# Patient Record
Sex: Male | Born: 1950
Health system: Southern US, Community
[De-identification: ages and names within clinical notes are randomized; demographics above are authoritative.]

## PROBLEM LIST (undated history)

## (undated) DIAGNOSIS — I10 Essential (primary) hypertension: Secondary | ICD-10-CM

## (undated) DIAGNOSIS — Z720 Tobacco use: Secondary | ICD-10-CM

## (undated) DIAGNOSIS — G459 Transient cerebral ischemic attack, unspecified: Secondary | ICD-10-CM

## (undated) HISTORY — PX: KNEE SURGERY: SHX244

## (undated) HISTORY — PX: CARPAL TUNNEL RELEASE: SHX101

---

## 1999-10-19 ENCOUNTER — Inpatient Hospital Stay (HOSPITAL_COMMUNITY): Admission: EM | Admit: 1999-10-19 | Discharge: 1999-10-20 | Payer: Self-pay | Admitting: Emergency Medicine

## 1999-10-19 ENCOUNTER — Encounter: Payer: Self-pay | Admitting: Emergency Medicine

## 2002-07-10 ENCOUNTER — Encounter: Payer: Self-pay | Admitting: *Deleted

## 2002-07-10 ENCOUNTER — Emergency Department (HOSPITAL_COMMUNITY): Admission: EM | Admit: 2002-07-10 | Discharge: 2002-07-10 | Payer: Self-pay | Admitting: *Deleted

## 2004-09-14 ENCOUNTER — Ambulatory Visit (HOSPITAL_BASED_OUTPATIENT_CLINIC_OR_DEPARTMENT_OTHER): Admission: RE | Admit: 2004-09-14 | Discharge: 2004-09-14 | Payer: Self-pay | Admitting: Orthopedic Surgery

## 2005-03-13 ENCOUNTER — Encounter: Admission: RE | Admit: 2005-03-13 | Discharge: 2005-03-13 | Payer: Self-pay | Admitting: Family Medicine

## 2006-02-14 ENCOUNTER — Emergency Department (HOSPITAL_COMMUNITY): Admission: EM | Admit: 2006-02-14 | Discharge: 2006-02-14 | Payer: Self-pay | Admitting: Emergency Medicine

## 2015-01-31 ENCOUNTER — Emergency Department (HOSPITAL_COMMUNITY): Payer: Medicare Other

## 2015-01-31 ENCOUNTER — Inpatient Hospital Stay (HOSPITAL_COMMUNITY)
Admission: EM | Admit: 2015-01-31 | Discharge: 2015-02-03 | DRG: 069 | Disposition: A | Discharge status: Home / Self Care | Payer: Medicare Other | Attending: Internal Medicine | Admitting: Internal Medicine

## 2015-01-31 ENCOUNTER — Encounter (HOSPITAL_COMMUNITY): Payer: Self-pay | Admitting: Emergency Medicine

## 2015-01-31 ENCOUNTER — Inpatient Hospital Stay (HOSPITAL_COMMUNITY): Payer: Medicare Other

## 2015-01-31 DIAGNOSIS — Z8249 Family history of ischemic heart disease and other diseases of the circulatory system: Secondary | ICD-10-CM | POA: Diagnosis not present

## 2015-01-31 DIAGNOSIS — I10 Essential (primary) hypertension: Secondary | ICD-10-CM | POA: Diagnosis present

## 2015-01-31 DIAGNOSIS — F17210 Nicotine dependence, cigarettes, uncomplicated: Secondary | ICD-10-CM | POA: Diagnosis present

## 2015-01-31 DIAGNOSIS — I639 Cerebral infarction, unspecified: Secondary | ICD-10-CM | POA: Diagnosis not present

## 2015-01-31 DIAGNOSIS — Z720 Tobacco use: Secondary | ICD-10-CM | POA: Diagnosis present

## 2015-01-31 DIAGNOSIS — W19XXXA Unspecified fall, initial encounter: Secondary | ICD-10-CM | POA: Diagnosis present

## 2015-01-31 DIAGNOSIS — R531 Weakness: Secondary | ICD-10-CM | POA: Diagnosis present

## 2015-01-31 DIAGNOSIS — Z9119 Patient's noncompliance with other medical treatment and regimen: Secondary | ICD-10-CM | POA: Diagnosis present

## 2015-01-31 DIAGNOSIS — Z7982 Long term (current) use of aspirin: Secondary | ICD-10-CM | POA: Diagnosis not present

## 2015-01-31 DIAGNOSIS — R29810 Facial weakness: Secondary | ICD-10-CM | POA: Diagnosis present

## 2015-01-31 DIAGNOSIS — N401 Enlarged prostate with lower urinary tract symptoms: Secondary | ICD-10-CM | POA: Diagnosis present

## 2015-01-31 DIAGNOSIS — K921 Melena: Secondary | ICD-10-CM | POA: Diagnosis present

## 2015-01-31 DIAGNOSIS — Z79899 Other long term (current) drug therapy: Secondary | ICD-10-CM | POA: Diagnosis not present

## 2015-01-31 DIAGNOSIS — R4701 Aphasia: Secondary | ICD-10-CM | POA: Diagnosis present

## 2015-01-31 DIAGNOSIS — M4802 Spinal stenosis, cervical region: Secondary | ICD-10-CM | POA: Diagnosis present

## 2015-01-31 DIAGNOSIS — Z9114 Patient's other noncompliance with medication regimen: Secondary | ICD-10-CM | POA: Diagnosis present

## 2015-01-31 DIAGNOSIS — G319 Degenerative disease of nervous system, unspecified: Secondary | ICD-10-CM | POA: Diagnosis present

## 2015-01-31 DIAGNOSIS — G43909 Migraine, unspecified, not intractable, without status migrainosus: Secondary | ICD-10-CM | POA: Diagnosis present

## 2015-01-31 DIAGNOSIS — R2990 Unspecified symptoms and signs involving the nervous system: Secondary | ICD-10-CM

## 2015-01-31 DIAGNOSIS — E785 Hyperlipidemia, unspecified: Secondary | ICD-10-CM | POA: Diagnosis present

## 2015-01-31 DIAGNOSIS — G459 Transient cerebral ischemic attack, unspecified: Secondary | ICD-10-CM | POA: Diagnosis present

## 2015-01-31 DIAGNOSIS — K625 Hemorrhage of anus and rectum: Secondary | ICD-10-CM

## 2015-01-31 DIAGNOSIS — Z8673 Personal history of transient ischemic attack (TIA), and cerebral infarction without residual deficits: Secondary | ICD-10-CM

## 2015-01-31 HISTORY — DX: Transient cerebral ischemic attack, unspecified: G45.9

## 2015-01-31 HISTORY — DX: Tobacco use: Z72.0

## 2015-01-31 HISTORY — DX: Essential (primary) hypertension: I10

## 2015-01-31 LAB — I-STAT CHEM 8, ED
BUN: 16 mg/dL (ref 6–23)
CREATININE: 0.9 mg/dL (ref 0.50–1.35)
Calcium, Ion: 1.12 mmol/L — ABNORMAL LOW (ref 1.13–1.30)
Chloride: 103 mmol/L (ref 96–112)
Glucose, Bld: 104 mg/dL — ABNORMAL HIGH (ref 70–99)
HCT: 48 % (ref 39.0–52.0)
Hemoglobin: 16.3 g/dL (ref 13.0–17.0)
POTASSIUM: 4 mmol/L (ref 3.5–5.1)
SODIUM: 139 mmol/L (ref 135–145)
TCO2: 23 mmol/L (ref 0–100)

## 2015-01-31 LAB — URINALYSIS, ROUTINE W REFLEX MICROSCOPIC
BILIRUBIN URINE: NEGATIVE
Glucose, UA: NEGATIVE mg/dL
Hgb urine dipstick: NEGATIVE
KETONES UR: NEGATIVE mg/dL
Leukocytes, UA: NEGATIVE
Nitrite: NEGATIVE
Protein, ur: NEGATIVE mg/dL
Specific Gravity, Urine: 1.033 — ABNORMAL HIGH (ref 1.005–1.030)
Urobilinogen, UA: 1 mg/dL (ref 0.0–1.0)
pH: 7 (ref 5.0–8.0)

## 2015-01-31 LAB — CBC
HCT: 42.6 % (ref 39.0–52.0)
Hemoglobin: 14.5 g/dL (ref 13.0–17.0)
MCH: 31.3 pg (ref 26.0–34.0)
MCHC: 34 g/dL (ref 30.0–36.0)
MCV: 92 fL (ref 78.0–100.0)
Platelets: 150 10*3/uL (ref 150–400)
RBC: 4.63 MIL/uL (ref 4.22–5.81)
RDW: 12.8 % (ref 11.5–15.5)
WBC: 4.7 10*3/uL (ref 4.0–10.5)

## 2015-01-31 LAB — DIFFERENTIAL
BASOS ABS: 0 10*3/uL (ref 0.0–0.1)
BASOS PCT: 0 % (ref 0–1)
Eosinophils Absolute: 0.1 10*3/uL (ref 0.0–0.7)
Eosinophils Relative: 2 % (ref 0–5)
Lymphocytes Relative: 35 % (ref 12–46)
Lymphs Abs: 1.7 10*3/uL (ref 0.7–4.0)
MONO ABS: 0.8 10*3/uL (ref 0.1–1.0)
Monocytes Relative: 17 % — ABNORMAL HIGH (ref 3–12)
NEUTROS ABS: 2.2 10*3/uL (ref 1.7–7.7)
Neutrophils Relative %: 46 % (ref 43–77)

## 2015-01-31 LAB — COMPREHENSIVE METABOLIC PANEL
ALT: 21 U/L (ref 0–53)
AST: 22 U/L (ref 0–37)
Albumin: 4.1 g/dL (ref 3.5–5.2)
Alkaline Phosphatase: 77 U/L (ref 39–117)
Anion gap: 6 (ref 5–15)
BILIRUBIN TOTAL: 0.8 mg/dL (ref 0.3–1.2)
BUN: 13 mg/dL (ref 6–23)
CHLORIDE: 105 mmol/L (ref 96–112)
CO2: 25 mmol/L (ref 19–32)
CREATININE: 0.88 mg/dL (ref 0.50–1.35)
Calcium: 8.9 mg/dL (ref 8.4–10.5)
GFR calc Af Amer: >90 mL/min (ref >90)
GFR, EST NON AFRICAN AMERICAN: 88 mL/min — AB (ref >90)
GLUCOSE: 100 mg/dL — AB (ref 70–99)
POTASSIUM: 4 mmol/L (ref 3.5–5.1)
Sodium: 136 mmol/L (ref 135–145)
Total Protein: 7.2 g/dL (ref 6.0–8.3)

## 2015-01-31 LAB — RAPID URINE DRUG SCREEN, HOSP PERFORMED
Amphetamines: NOT DETECTED
BARBITURATES: NOT DETECTED
Benzodiazepines: NOT DETECTED
Cocaine: NOT DETECTED
Opiates: NOT DETECTED
TETRAHYDROCANNABINOL: NOT DETECTED

## 2015-01-31 LAB — PROTIME-INR
INR: 1.06 (ref 0.00–1.49)
PROTHROMBIN TIME: 14 s (ref 11.6–15.2)

## 2015-01-31 LAB — I-STAT TROPONIN, ED: Troponin i, poc: 0.01 ng/mL (ref 0.00–0.08)

## 2015-01-31 LAB — TSH: TSH: 1.145 u[IU]/mL (ref 0.350–4.500)

## 2015-01-31 LAB — ETHANOL

## 2015-01-31 LAB — GLUCOSE, CAPILLARY: Glucose-Capillary: 94 mg/dL (ref 70–99)

## 2015-01-31 LAB — APTT: aPTT: 28 seconds (ref 24–37)

## 2015-01-31 MED ORDER — PNEUMOCOCCAL VAC POLYVALENT 25 MCG/0.5ML IJ INJ
0.5000 mL | INJECTION | INTRAMUSCULAR | Status: AC
  Administered 2015-02-01: 0.5 mL via INTRAMUSCULAR
  Filled 2015-01-31: qty 0.5

## 2015-01-31 MED ORDER — STROKE: EARLY STAGES OF RECOVERY BOOK
Freq: Once | Status: AC
  Administered 2015-01-31: 14:00:00

## 2015-01-31 MED ORDER — SENNOSIDES-DOCUSATE SODIUM 8.6-50 MG PO TABS: 1.0000 | ORAL_TABLET | Freq: Every evening | ORAL | Status: DC | PRN

## 2015-01-31 MED ORDER — INFLUENZA VAC SPLIT QUAD 0.5 ML IM SUSY
0.5000 mL | PREFILLED_SYRINGE | INTRAMUSCULAR | Status: AC
  Administered 2015-02-01: 0.5 mL via INTRAMUSCULAR
  Filled 2015-01-31 (×2): qty 0.5

## 2015-01-31 MED ORDER — HEPARIN SODIUM (PORCINE) 5000 UNIT/ML IJ SOLN
5000.0000 [IU] | Freq: Three times a day (TID) | INTRAMUSCULAR | Status: DC
  Administered 2015-01-31 – 2015-02-03 (×9): 5000 [IU] via SUBCUTANEOUS
  Filled 2015-01-31 (×10): qty 1

## 2015-01-31 MED ORDER — ASPIRIN 300 MG RE SUPP: 300.0000 mg | Freq: Every day | RECTAL | Status: DC

## 2015-01-31 MED ORDER — IOHEXOL 350 MG/ML SOLN
80.0000 mL | Freq: Once | INTRAVENOUS | Status: AC | PRN
  Administered 2015-01-31: 80 mL via INTRAVENOUS

## 2015-01-31 MED ORDER — ASPIRIN 325 MG PO TABS
325.0000 mg | ORAL_TABLET | Freq: Every day | ORAL | Status: DC
  Administered 2015-01-31 – 2015-02-03 (×4): 325 mg via ORAL
  Filled 2015-01-31 (×4): qty 1

## 2015-01-31 NOTE — ED Provider Notes (Signed)
CSN: [id number]     Arrival date & time 01/31/15  [id number] History   First MD Initiated Contact with Patient 01/31/15 [phone]     Chief Complaint  Patient presents with  . Code Stroke     (Consider location/radiation/quality/duration/timing/severity/associated sxs/prior Treatment) The history is provided by the patient.  Alexander Anthony is a 64 y.o. male here presenting with code stroke. Patient woke up this morning and was weak on the right side and then has slurred speech and fell. He had a similar symptoms yesterday lasted 45 minutes but didn't come to the hospital. Patient has not seen doctors in years.   Level V caveat- condition of patient.    History reviewed. No pertinent past medical history. Past Surgical History  Procedure Laterality Date  . Knee surgery      Right   No family history on file. History  Substance Use Topics  . Smoking status: Current Every Day Smoker  . Smokeless tobacco: Not on file  . Alcohol Use: No    Review of Systems  Neurological: Positive for speech difficulty and weakness.  All other systems reviewed and are negative.     Allergies  Review of patient's allergies indicates no known allergies.  Home Medications   Prior to Admission medications   Not on File   BP 183/91 mmHg  Pulse 63  Temp(Src) 97.7 F (36.5 C) (Oral)  Resp 15  SpO2 100% Physical Exam  Constitutional:  Chronically ill   HENT:  Head: Normocephalic.  Mouth/Throat: Oropharynx is clear and moist.  Eyes: Pupils are equal, round, and reactive to light.  Neck:  C collar in place  Cardiovascular: Normal rate, regular rhythm and normal heart sounds.   Pulmonary/Chest: Effort normal and breath sounds normal. No respiratory distress. He has no wheezes. He has no rales.  Abdominal: Soft. Bowel sounds are normal. He exhibits no distension. There is no tenderness. There is no rebound.  Musculoskeletal: Normal range of motion.  Mild mid thoracic tenderness   Neurological: He  is alert.  Mild slurred speech. Strength 4/5 R side   Skin: Skin is warm and dry.  Psychiatric: He has a normal mood and affect. His behavior is normal. Judgment and thought content normal.  Nursing note and vitals reviewed.   ED Course  Procedures (including critical care time) Labs Review Labs Reviewed  DIFFERENTIAL - Abnormal; Notable for the following:    Monocytes Relative 17 (*)    All other components within normal limits  COMPREHENSIVE METABOLIC PANEL - Abnormal; Notable for the following:    Glucose, Bld 100 (*)    GFR calc non Af Amer 88 (*)    All other components within normal limits  I-STAT CHEM 8, ED - Abnormal; Notable for the following:    Glucose, Bld 104 (*)    Calcium, Ion 1.12 (*)    All other components within normal limits  ETHANOL  PROTIME-INR  APTT  CBC  URINE RAPID DRUG SCREEN (HOSP PERFORMED)  URINALYSIS, ROUTINE W REFLEX MICROSCOPIC  I-STAT TROPOININ, ED  Rosezena SensorI-STAT TROPOININ, ED    Imaging Review Dg Chest 1 View  01/31/2015   CLINICAL DATA:  Fall, stroke  EXAM: CHEST  1 VIEW  COMPARISON:  None.  FINDINGS: Borderline cardiomegaly. Central vascular congestion without convincing pulmonary edema. Study is limited by poor inspiration. No segmental infiltrate.  IMPRESSION: Limited study by poor inspiration. Central vascular congestion without convincing pulmonary edema. No segmental infiltrate.   Electronically Signed   By: Lang SnowLiviu  Pop M.D.   On: 01/31/2015 08:51   Dg Thoracic Spine 2 View  01/31/2015   CLINICAL DATA:  Fall, upper back pain  EXAM: THORACIC SPINE - 2 VIEW  COMPARISON:  None.  FINDINGS: Three views of thoracic spine submitted. No acute fracture or subluxation degenerative changes are noted mid and lower thoracic spine. Alignment and vertebral body heights are preserved.  IMPRESSION: No acute fracture or subluxation. Degenerative changes mid and lower thoracic spine.   Electronically Signed   By: Natasha Mead M.D.   On: 01/31/2015 08:52   Dg Pelvis  1-2 Views  01/31/2015   CLINICAL DATA:  Pain following trauma/fall  EXAM: PELVIS - 1-2 VIEW  COMPARISON:  None.  FINDINGS: Contrast is seen in the distal ureters and urinary bladder. There is no demonstrable fracture or dislocation. Joint spaces appear intact. No erosive change. 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  IMPRESSION: No fracture or dislocation. 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 Joint spaces appear intact.   Electronically Signed   By: Bretta Bang III M.D.   On: 01/31/2015 08:51   Ct Head Wo Contrast  01/31/2015   CLINICAL DATA:  Code stroke, found on the floor, right side weakness  EXAM: CT HEAD WITHOUT CONTRAST  TECHNIQUE: Contiguous axial images were obtained from the base of the skull through the vertex without intravenous contrast.  COMPARISON:  None.  FINDINGS: No skull fracture is noted. There is mucosal thickening with complete opacification of the left maxillary sinus. There is thickening of bony wall of the left maxillary sinus. Mucosal thickening with partial opacification left ethmoid air cells. Mucosal thickening left sphenoid sinus. Mucosal thickening with complete opacification left frontal sinus.  No intracranial hemorrhage, mass effect or midline shift. No definite acute cortical infarction. No mass lesion is noted on this unenhanced scan. Mild cerebral atrophy.  IMPRESSION: No definite acute cortical infarction. Mild cerebral atrophy. No intracranial hemorrhage, mass effect or midline shift. Mucosal thickening with complete opacification of the left maxillary sinus. Thickening of bony wall of left maxillary sinus. Mucosal thickening with partial opacification left ethmoid air cells. Mucosal thickening left sphenoid sinus. Complete opacification left frontal sinus.  These results were called by telephone at the time of interpretation on 01/31/2015 at 8:16 am to Dr. Thad Ranger, who verbally acknowledged these results.   Electronically Signed   By: Natasha Mead M.D.   On: 01/31/2015  08:17     EKG Interpretation   Date/Time:  Tuesday January 31 2015 08:06:19 EST Ventricular Rate:  66 PR Interval:  245 QRS Duration: 72 QT Interval:  415 QTC Calculation: 435 R Axis:   18 Text Interpretation:  Sinus rhythm Prolonged PR interval Probable left  atrial enlargement Anterior infarct, old prolonged PR new Confirmed by Jaiah Weigel   MD, Odelle Kosier ([id number]) on 01/31/2015 8:43:57 AM      MDM   Final diagnoses:  Marletta Lor    Alexander Anthony is a 64 y.o. male here with fall, code stroke. Dr. Thad Ranger at bedside. Doesn't need TPA currently. Neuro ordered CT angio neck which is unremarkable. Recommend admission for stroke workup.     Richardean Canal, MD 01/31/15 [phone]

## 2015-01-31 NOTE — H&P (Signed)
Date: 01/31/2015               Patient Name:  Alexander Anthony MRN: [id number]  DOB: October 20, 1951 Age / Sex: 64 y.o., male   PCP: No primary care provider on file.         Medical Service: Internal Medicine Teaching Service         Attending Physician: Dr. Farley Ly, MD    First Contact: Dr. Danella Penton Pager: [phone]  Second Contact: Dr. Andrey Campanile Pager: [phone]       After Hours (After 5p/  First Contact Pager: [phone]  weekends / holidays): Second Contact Pager: [phone]   Chief Complaint: Right sided weakness with trouble speaking  History of Present Illness: 64yo M w/ PMH HTN (noncompliant with medications) and tobacco abuse presents with R sided weakness and aphasia. He states that the symptoms began yesterday with numbness and weakness in his right face, hand, and leg, and he then developed slurred speech. He declined coming to the ED at that time per pt and his wife. This morning the weakness worsened and he fell but denies LOC or injury. EMS was called and the patient was brought to the ED as a code stroke. In the ED, his speech has improved and his weakness is improving per pt. He states that he is having tingling in his right hand and fingers.  Imaging in the ED negative for fractures after fall.   He states that he has been on medication for his blood pressure in the past but has not taken any in years. He does smoke 2-4 cigars a day since 1980. He does not do drugs and only drinks beer approximately once a month. He is on no medications.   He does endorse intermittent bloody stools and abdominal pain with vomiting over the past year. He denies blood on the toilet paper and states that it is in the toilet after bowel movements. He has never had a colonoscopy.  He has a brother with DM and HLD. His father died from an MI and mother of cerebral aneurysm.    Meds: No current facility-administered medications for this encounter.   Current Outpatient Prescriptions  Medication  Sig Dispense Refill  . aspirin 81 MG tablet Take 81 mg by mouth daily as needed for pain.    Marland Kitchen loratadine (CLARITIN) 10 MG tablet Take 10 mg by mouth daily as needed for allergies.    . sodium chloride (OCEAN) 0.65 % SOLN nasal spray Place 1 spray into both nostrils as needed for congestion.      Allergies: Allergies as of 01/31/2015  . (No Known Allergies)   History reviewed. No pertinent past medical history. Past Surgical History  Procedure Laterality Date  . Knee surgery      Right   No family history on file. History   Social History  . Marital Status: Married    Spouse Name: N/A  . Number of Children: N/A  . Years of Education: N/A   Occupational History  . Not on file.   Social History Main Topics  . Smoking status: Current Every Day Smoker  . Smokeless tobacco: Not on file  . Alcohol Use: No  . Drug Use: No  . Sexual Activity: Not on file   Other Topics Concern  . Not on file   Social History Narrative  . No narrative on file    Review of Systems: Review of Systems  Constitutional: Denies fever, chills, or appetite change.  HEENT: Denies trouble swallowing, neck pain.  Respiratory: Denies SOB, DOE, cough. +wheezing.  Cardiovascular: Denies chest pain, palpitations or leg swelling.  Gastrointestinal: +nausea, vomiting, abdominal pain, and blood in his stool. Denies diarrhea, constipation.  Genitourinary: Denies dysuria, urgency, hematuria, flank pain or difficulty urinating. +urinary frequency and hesitancy with urination. Musculoskeletal: Denies myalgias Skin: Denies rashes.  Neurological: Denies dizziness, headaches, or syncope. +right sided weakness, numbness, and tingling, and slurred speech.  Psychiatric/Behavioral: Denies suicidal ideation, mood changes, confusion    Physical Exam: Blood pressure 157/85, pulse 61, temperature 97.5 F (36.4 C), temperature source Oral, resp. rate 18, SpO2 97 %. General: Well-developed, and cooperative on  examination, in no acute distress.  Head: Normocephalic and atraumatic.  Eyes: EOMI, pupils equal, round, and reactive to light, no injection and anicteric.  Mouth: Pharynx pink and moist, no erythema or exudates.  Neck: Supple, full ROM, no JVD.  Lungs: CTAB anteriorly, normal respiratory effort, no accessory muscle use, no crackles, and no wheezes. Exam limited due to pt lying flat, given code stroke Heart: Regular rate, regular rhythm, no murmur, no gallop, and no rub.  Abdomen: Soft, TTP in mid epigastrium and mid to left lower quadrant. Non-distended, normal bowel sounds, no guarding, no rebound tenderness, no organomegaly or masses palpable. Msk: No joint swelling, warmth, or erythema.  Extremities: 2+ DP pulses bilaterally. No edema Neurologic: Alert & oriented X3, cranial nerves II-XII intact, except in V 1-3 with diminished sensation on the right face and mild right facial droop, strength 3/5 in RU&LE, 5/5 in LU&LE, sensation diminished on right side but intact on left to light touch, gait not observed.  Skin: Turgor normal.  Psych: Memory intact for recent and remote, good eye contact, not anxious or depressed appearing.    Lab results: Basic Metabolic Panel:  Recent Labs  16/09/9601/23/16 0744 01/31/15 0749  NA 136 139  K 4.0 4.0  CL 105 103  CO2 25  --   GLUCOSE 100* 104*  BUN 13 16  CREATININE 0.88 0.90  CALCIUM 8.9  --    Liver Function Tests:  Recent Labs  01/31/15 0744  AST 22  ALT 21  ALKPHOS 77  BILITOT 0.8  PROT 7.2  ALBUMIN 4.1   CBC:  Recent Labs  01/31/15 0744 01/31/15 0749  WBC 4.7  --   NEUTROABS 2.2  --   HGB 14.5 16.3  HCT 42.6 48.0  MCV 92.0  --   PLT 150  --    Cardiac Enzymes: 01/31/15 i-stat troponin 0.01  Hemoglobin A1C: Pending  Fasting Lipid Panel: Pending  Thyroid Function Tests: Pending  Coagulation:  Recent Labs  01/31/15 0744  LABPROT 14.0  INR 1.06   Urine Drug Screen: Drugs of Abuse  Pending  Alcohol  Level:  Recent Labs  01/31/15 0742  ETH <5   Urinalysis: Pending   Imaging results:  Ct Angio Head W/cm &/or Wo Cm  01/31/2015   CLINICAL DATA:  64 year old male code stroke. Found down with right side weakness. Initial encounter.  EXAM: CT ANGIOGRAPHY HEAD AND NECK  TECHNIQUE: Multidetector CT imaging of the head and neck was performed using the standard protocol during bolus administration of intravenous contrast. Multiplanar CT image reconstructions and MIPs were obtained to evaluate the vascular anatomy. Carotid stenosis measurements (when applicable) are obtained utilizing NASCET criteria, using the distal internal carotid diameter as the denominator.  CONTRAST:  80mL OMNIPAQUE IOHEXOL 350 MG/ML SOLN  COMPARISON:  Head CT without contrast 0750 hours today.  FINDINGS: CT HEAD  Brain: Stable gray-white matter differentiation. No evidence of cortically based acute infarction identified. No intracranial mass effect.  Calvarium and skull base: Stable and intact.  Paranasal sinuses: Obstructive pattern left paranasal sinusitis with mucoperiosteal thickening. Retained secretions in the left nasal cavity and nasopharynx.  Orbits: Negative.  CTA NECK  Aortic arch: Ectatic arch. Three vessel arch configuration. Tortuous proximal great vessels. No great vessel origin stenosis.  Right carotid system: Negative right CCA aside from mild proximal tortuosity. Soft and calcified plaque at the right carotid bifurcation and right ICA bulb not resulting in stenosis.  Left carotid system: Negative left CCA origin. Proximal left CCA tortuosity. Mild soft and calcified plaque at the left carotid bifurcation and left ICA bulb with no stenosis.  Vertebral arteries:No proximal subclavian arteries stenosis. Both vertebral artery origins appear within normal limits.  Tortuous proximal vertebral arteries more so the left. Fairly codominant vertebrals. Tortuous distal cervical vertebral arteries, no vertebral artery stenosis in  the neck.  Skeleton: No acute osseous abnormality identified. Cervical spine findings are reported separately.  Other neck: Negative lung apices aside from atelectasis. No superior mediastinal lymphadenopathy. Thyroid, larynx, pharynx, parapharyngeal spaces, retropharyngeal space, sublingual space, submandibular glands, and parotid glands are within normal limits. No cervical lymphadenopathy.  CTA HEAD  Posterior circulation: Codominant distal vertebral arteries are patent without stenosis. Normal PICA origins. Tortuous but otherwise negative vertebrobasilar junction. Tortuous basilar artery without stenosis. SCA and PCA origins are within normal limits. P1 segment tortuosity is specially on the left. Posterior communicating arteries are diminutive or absent. Mild to moderate irregularity of the left P2 segment. Mild to moderate irregularity of the right P3 branches. Preserved bilateral distal PCA flow.  Anterior circulation: Patent ICA siphons with no siphon stenosis. Calcified siphon plaque more so on the left. Ophthalmic artery origins are within normal limits. Tortuous distal siphons and carotid termini. MCA and ACA origins are within normal limits.  Tortuous proximal ACAs. Diminutive or absent anterior communicating artery. Negative ACA branches aside from tortuosity.  Right MCA branches are normal aside from mild irregularity.  Left MCA M1 segment is mildly irregular. Left MCA bifurcation is patent. Left MCA branches are normal aside from mild irregularity.  Venous sinuses: Within normal limits.  Anatomic variants: None.  IMPRESSION: 1. No carotid or anterior circulation hemodynamically significant stenosis or occlusion. 2. Bilateral PCA atherosclerosis, affecting the left P2 and right P3 segments. No major PCA branch occlusion. 3. Stable CT appearance of the brain since 0750 hours with no acute cortically based infarct or acute intracranial hemorrhage identified. 4. Cervical spine CT reported separately. 5.  Obstructive pattern left paranasal sinusitis appears to be acute on chronic. Preliminary report of the vascular findings discussed by telephone with Dr. Thana Farr at [phone] hours on 01/31/2015.   Electronically Signed   By: Odessa Fleming M.D.   On: 01/31/2015 09:35   Dg Chest 1 View  01/31/2015   CLINICAL DATA:  Fall, stroke  EXAM: CHEST  1 VIEW  COMPARISON:  None.  FINDINGS: Borderline cardiomegaly. Central vascular congestion without convincing pulmonary edema. Study is limited by poor inspiration. No segmental infiltrate.  IMPRESSION: Limited study by poor inspiration. Central vascular congestion without convincing pulmonary edema. No segmental infiltrate.   Electronically Signed   By: Natasha Mead M.D.   On: 01/31/2015 08:51   Dg Thoracic Spine 2 View  01/31/2015   CLINICAL DATA:  Fall, upper back pain  EXAM: THORACIC SPINE - 2 VIEW  COMPARISON:  None.  FINDINGS: Three views of thoracic spine submitted. No acute fracture or subluxation degenerative changes are noted mid and lower thoracic spine. Alignment and vertebral body heights are preserved.  IMPRESSION: No acute fracture or subluxation. Degenerative changes mid and lower thoracic spine.   Electronically Signed   By: Natasha Mead M.D.   On: 01/31/2015 08:52   Dg Pelvis 1-2 Views  01/31/2015   CLINICAL DATA:  Pain following trauma/fall  EXAM: PELVIS - 1-2 VIEW  COMPARISON:  None.  FINDINGS: Contrast is seen in the distal ureters and urinary bladder. There is no demonstrable fracture or dislocation. Joint spaces appear intact. No erosive change. 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  IMPRESSION: No fracture or dislocation. 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 Joint spaces appear intact.   Electronically Signed   By: Bretta Bang III M.D.   On: 01/31/2015 08:51   Ct Head Wo Contrast  01/31/2015   CLINICAL DATA:  Code stroke, found on the floor, right side weakness  EXAM: CT HEAD WITHOUT CONTRAST  TECHNIQUE: Contiguous axial images were obtained from  the base of the skull through the vertex without intravenous contrast.  COMPARISON:  None.  FINDINGS: No skull fracture is noted. There is mucosal thickening with complete opacification of the left maxillary sinus. There is thickening of bony wall of the left maxillary sinus. Mucosal thickening with partial opacification left ethmoid air cells. Mucosal thickening left sphenoid sinus. Mucosal thickening with complete opacification left frontal sinus.  No intracranial hemorrhage, mass effect or midline shift. No definite acute cortical infarction. No mass lesion is noted on this unenhanced scan. Mild cerebral atrophy.  IMPRESSION: No definite acute cortical infarction. Mild cerebral atrophy. No intracranial hemorrhage, mass effect or midline shift. Mucosal thickening with complete opacification of the left maxillary sinus. Thickening of bony wall of left maxillary sinus. Mucosal thickening with partial opacification left ethmoid air cells. Mucosal thickening left sphenoid sinus. Complete opacification left frontal sinus.  These results were called by telephone at the time of interpretation on 01/31/2015 at 8:16 am to Dr. Thad Ranger, who verbally acknowledged these results.   Electronically Signed   By: Natasha Mead M.D.   On: 01/31/2015 08:17   Ct Angio Neck W/cm &/or Wo/cm  01/31/2015   CLINICAL DATA:  64 year old male code stroke. Found down with right side weakness. Initial encounter.  EXAM: CT ANGIOGRAPHY HEAD AND NECK  TECHNIQUE: Multidetector CT imaging of the head and neck was performed using the standard protocol during bolus administration of intravenous contrast. Multiplanar CT image reconstructions and MIPs were obtained to evaluate the vascular anatomy. Carotid stenosis measurements (when applicable) are obtained utilizing NASCET criteria, using the distal internal carotid diameter as the denominator.  CONTRAST:  80mL OMNIPAQUE IOHEXOL 350 MG/ML SOLN  COMPARISON:  Head CT without contrast 0750 hours today.   FINDINGS: CT HEAD  Brain: Stable gray-white matter differentiation. No evidence of cortically based acute infarction identified. No intracranial mass effect.  Calvarium and skull base: Stable and intact.  Paranasal sinuses: Obstructive pattern left paranasal sinusitis with mucoperiosteal thickening. Retained secretions in the left nasal cavity and nasopharynx.  Orbits: Negative.  CTA NECK  Aortic arch: Ectatic arch. Three vessel arch configuration. Tortuous proximal great vessels. No great vessel origin stenosis.  Right carotid system: Negative right CCA aside from mild proximal tortuosity. Soft and calcified plaque at the right carotid bifurcation and right ICA bulb not resulting in stenosis.  Left carotid system: Negative left CCA origin. Proximal left CCA tortuosity. Mild soft and calcified plaque at  the left carotid bifurcation and left ICA bulb with no stenosis.  Vertebral arteries:No proximal subclavian arteries stenosis. Both vertebral artery origins appear within normal limits.  Tortuous proximal vertebral arteries more so the left. Fairly codominant vertebrals. Tortuous distal cervical vertebral arteries, no vertebral artery stenosis in the neck.  Skeleton: No acute osseous abnormality identified. Cervical spine findings are reported separately.  Other neck: Negative lung apices aside from atelectasis. No superior mediastinal lymphadenopathy. Thyroid, larynx, pharynx, parapharyngeal spaces, retropharyngeal space, sublingual space, submandibular glands, and parotid glands are within normal limits. No cervical lymphadenopathy.  CTA HEAD  Posterior circulation: Codominant distal vertebral arteries are patent without stenosis. Normal PICA origins. Tortuous but otherwise negative vertebrobasilar junction. Tortuous basilar artery without stenosis. SCA and PCA origins are within normal limits. P1 segment tortuosity is specially on the left. Posterior communicating arteries are diminutive or absent. Mild to  moderate irregularity of the left P2 segment. Mild to moderate irregularity of the right P3 branches. Preserved bilateral distal PCA flow.  Anterior circulation: Patent ICA siphons with no siphon stenosis. Calcified siphon plaque more so on the left. Ophthalmic artery origins are within normal limits. Tortuous distal siphons and carotid termini. MCA and ACA origins are within normal limits.  Tortuous proximal ACAs. Diminutive or absent anterior communicating artery. Negative ACA branches aside from tortuosity.  Right MCA branches are normal aside from mild irregularity.  Left MCA M1 segment is mildly irregular. Left MCA bifurcation is patent. Left MCA branches are normal aside from mild irregularity.  Venous sinuses: Within normal limits.  Anatomic variants: None.  IMPRESSION: 1. No carotid or anterior circulation hemodynamically significant stenosis or occlusion. 2. Bilateral PCA atherosclerosis, affecting the left P2 and right P3 segments. No major PCA branch occlusion. 3. Stable CT appearance of the brain since 0750 hours with no acute cortically based infarct or acute intracranial hemorrhage identified. 4. Cervical spine CT reported separately. 5. Obstructive pattern left paranasal sinusitis appears to be acute on chronic. Preliminary report of the vascular findings discussed by telephone with Dr. Thana Farr at [phone] hours on 01/31/2015.   Electronically Signed   By: Odessa Fleming M.D.   On: 01/31/2015 09:35   Ct Cervical Spine Wo Contrast  01/31/2015   CLINICAL DATA:  64 year old male found down with right side weakness. Code stroke. Initial encounter.  EXAM: CT CERVICAL SPINE WITHOUT CONTRAST  TECHNIQUE: Multidetector CT imaging of the cervical spine was performed without intravenous contrast. Multiplanar CT image reconstructions were also generated.  COMPARISON:  Head CT without contrast 0750 hours today. Head and neck CTA reported separately.  FINDINGS: Straightening of cervical lordosis. Cervicothoracic  junction alignment is within normal limits. Visualized skull base is intact. No atlanto-occipital dissociation. Bilateral posterior element alignment is within normal limits. Widespread advanced cervical disc and endplate degeneration, including bulky endplate osteophytosis. No acute cervical spine fracture identified. Grossly intact visualized upper thoracic levels.  Multifactorial mild to moderate degenerative spinal stenosis at C5-C6 and C6-C7. Moderate to severe lower cervical bilateral neural foraminal stenosis. Negative paraspinal soft tissues.  IMPRESSION: 1. No acute fracture or listhesis identified in the cervical spine. Ligamentous injury is not excluded. 2. CTA Head And Neck reported separately. 3. Lower cervical spine degeneration with mild to moderate degenerative spinal stenosis and moderate to severe degenerative biforaminal stenosis.   Electronically Signed   By: Odessa Fleming M.D.   On: 01/31/2015 09:40    Other results: EKG: Sinus rhythm. Normal axis. Normal QTC. Artifact present, appears to have T wave inversion  in III. No prior for comparison.  Assessment & Plan by Problem: 64yo M w/ PMH HTN (noncompliant with medications) and tobacco abuse presents with R sided weakness and aphasia concerning for stroke.  #Stroke-like symptoms: Pt with acute onset R sided weakness and slurred speech which worsened this morning but has improved somewhat in the ED. He was outside the tPA window. CT head negative for acute infarct or bleed. In the setting of uncontrolled HTN and tobacco abuse, he is at an increased risk for stroke. Neurology was consulted and will pursue stroke work up.  - Admit to IMTS to tele - NPO - MRI/MRA head/brain - TTE - Carotid dopplers - PT/OT/SLP - Allowing for permissive hypertension - Checking A1c, lipid panel, TSH, UDS  #HTN (hypertension): Pt with a h/o HTN and medication non-compliance. Possibly previously took losartan, but that was a number of years ago per pt. BP  elevated in the ED. Allowing for permissive HTN over the next 48hrs, assuming acute CVA.   #Tobacco abuse: Pt smokes 2-4 cigars a day and has since 49.  - Consulting CSW for cessation  #Blood per rectum: per pt and his wife, has has been experiencing blood in the toilet after a bowel movement intermittently over the past year. He denies constipation or hemorrhoids and states that there is not blood on the toilet paper after wiping. He has never had a colonoscopy. On exam he has TTP in mid-epigastrium but also in the mid-left lower quadrant, no palpable masses. He denies current bleeding. His Hgb is stable. Given that he is strict bedrest with HOB flat, deferring FOBT at this time.  - FOBT once able - Pt will need colonoscopy - CBC in am  #Likely BPH: Pt with urinary frequency and hesitancy with urination. Increased nocturia. He likely has BPH given his age. May benefit from Alpha-1 blocker which provide more immediate results that a 5-alpha-reductase inhibitor, which take months before seeing symptom improvement.  - Checking UA  #DVT PPx: [place] Heparin   Dispo: Disposition is deferred at this time, awaiting improvement of current medical problems. Anticipated discharge in approximately 1-3 day(s).   The patient does not have a current PCP (No primary care provider on file.) and may need an [facility] hospital follow-up appointment after discharge.  The patient does not have transportation limitations that hinder transportation to clinic appointments.  Signed: Genelle Gather, MD 01/31/2015, 10:35 AM

## 2015-01-31 NOTE — Consult Note (Signed)
Referring Physician: Silverio Lay    Chief Complaint: Right sided weakness, aphasia  HPI: Alexander Anthony is an 64 y.o. male who reports that he awakened this morning and noted numbness in his right hand and right side of his face.  These symptoms progressed to complete right sided flaccidity and the inability to speak. The patient fell at that time.  EMS was called and the patient was brought in for evaluation.   Patient reports that he had an episode yesterday of inability to use the right side and difficulty with speech that started about 1300 and lasted about 45 minutes before complete spontaneous resolution.  He did not seek medical attention at that time.   Initial NIHSS was after some improvement in symptoms that occurred during transport and was 1.    Date last known well: Date: 01/30/2015 Time last known well: Time: 23:00 tPA Given: No: Improvement in symptoms  Past medical history: Hypertension-patient noncompliant with medications  Past Surgical History  Procedure Laterality Date  . Knee surgery      Right    Family history: Father with CAD and died of an MI.  Mother died of a cerebral aneurysm  Social History:  reports that he has been smoking.  He does not have any smokeless tobacco history on file. He reports that he does not drink alcohol or use illicit drugs.  Allergies: No Known Allergies  Medications: I have reviewed the patient's current medications. Prior to Admission:  ASA prn  ROS: History obtained from the patient  General ROS:  fatigue Psychological ROS: negative for - behavioral disorder, hallucinations, memory difficulties, mood swings or suicidal ideation Ophthalmic ROS: negative for - blurry vision, double vision, eye pain or loss of vision ENT ROS: negative for - epistaxis, nasal discharge, oral lesions, sore throat, tinnitus or vertigo Allergy and Immunology ROS: negative for - hives or itchy/watery eyes Hematological and Lymphatic ROS: negative for -  bleeding problems, bruising or swollen lymph nodes Endocrine ROS: negative for - galactorrhea, hair pattern changes, polydipsia/polyuria or temperature intolerance Respiratory ROS: negative for - cough, hemoptysis, shortness of breath or wheezing Cardiovascular ROS: negative for - chest pain, dyspnea on exertion, edema or irregular heartbeat Gastrointestinal ROS: negative for - abdominal pain, diarrhea, hematemesis, nausea/vomiting or stool incontinence Genito-Urinary ROS: negative for - dysuria, hematuria, incontinence or urinary frequency/urgency Musculoskeletal ROS: back pain Neurological ROS: as noted in HPI Dermatological ROS: negative for rash and skin lesion changes  Physical Examination: Blood pressure 183/91, pulse 63, temperature 97.7 F (36.5 C), temperature source Oral, resp. rate 15, SpO2 100 %.  HEENT-  Normocephalic, no lesions, without obvious abnormality.  Normal external eye and conjunctiva.  Normal TM's bilaterally.  Normal auditory canals and external ears. Normal external nose, mucus membranes and septum.  Normal pharynx. Cardiovascular- S1, S2 normal, pulses palpable throughout   Lungs- chest clear, no wheezing, rales, normal symmetric air entry Abdomen- soft, non-tender; bowel sounds normal; no masses,  no organomegaly Extremities- no edema Lymph-no adenopathy palpable Musculoskeletal-no joint tenderness, deformity or swelling Skin-hyperpigmented areas on the lower extremities bilaterally  Neurological Examination Mental Status: Alert, oriented, thought content appropriate.  Speech fluent without evidence of aphasia.  Able to follow 3 step commands without difficulty. Cranial Nerves: II: Discs flat bilaterally; Visual fields grossly normal, pupils equal, round, reactive to light and accommodation III,IV, VI: ptosis not present, extra-ocular motions intact bilaterally with nystagmus on right lateral gaze V,VII: smile symmetric, facial light touch sensation decreased  on the right side of  the face VIII: hearing normal bilaterally IX,X: gag reflex present XI: bilateral shoulder shrug XII: midline tongue extension Motor: Right : Upper extremity   5-/5    Left:     Upper extremity   5/5  Lower extremity   5-/5     Lower extremity   5/5 Tone and bulk:normal tone throughout; no atrophy noted Sensory: Pinprick and light touch decreased on the right arm and leg Deep Tendon Reflexes: 2+ and symmetric throughout with absent AJ's bilaterally Plantars: Right: downgoing   Left: downgoing Cerebellar: normal finger-to-nose and normal heel-to-shin testing bilaterally Gait: unable to test due to safety concerns   Laboratory Studies:  Basic Metabolic Panel:  Recent Labs Lab 01/31/15 0744 01/31/15 0749  NA 136 139  K 4.0 4.0  CL 105 103  CO2 25  --   GLUCOSE 100* 104*  BUN 13 16  CREATININE 0.88 0.90  CALCIUM 8.9  --     Liver Function Tests:  Recent Labs Lab 01/31/15 0744  AST 22  ALT 21  ALKPHOS 77  BILITOT 0.8  PROT 7.2  ALBUMIN 4.1   No results for input(s): LIPASE, AMYLASE in the last 168 hours. No results for input(s): AMMONIA in the last 168 hours.  CBC:  Recent Labs Lab 01/31/15 0744 01/31/15 0749  WBC 4.7  --   NEUTROABS 2.2  --   HGB 14.5 16.3  HCT 42.6 48.0  MCV 92.0  --   PLT 150  --     Cardiac Enzymes: No results for input(s): CKTOTAL, CKMB, CKMBINDEX, TROPONINI in the last 168 hours.  BNP: Invalid input(s): POCBNP  CBG: No results for input(s): GLUCAP in the last 168 hours.  Microbiology: No results found for this or any previous visit.  Coagulation Studies:  Recent Labs  01/31/15 0744  LABPROT 14.0  INR 1.06    Urinalysis: No results for input(s): COLORURINE, LABSPEC, PHURINE, GLUCOSEU, HGBUR, BILIRUBINUR, KETONESUR, PROTEINUR, UROBILINOGEN, NITRITE, LEUKOCYTESUR in the last 168 hours.  Invalid input(s): APPERANCEUR  Lipid Panel: No results found for: CHOL, TRIG, HDL, CHOLHDL, VLDL,  LDLCALC  HgbA1C: No results found for: HGBA1C  Urine Drug Screen:  No results found for: LABOPIA, COCAINSCRNUR, LABBENZ, AMPHETMU, THCU, LABBARB  Alcohol Level:   Recent Labs Lab 01/31/15 0742  ETH <5    Other results: EKG: sinus rhythm at 66 bpm, prolonged PR interval.  Imaging: Dg Chest 1 View  01/31/2015   CLINICAL DATA:  Fall, stroke  EXAM: CHEST  1 VIEW  COMPARISON:  None.  FINDINGS: Borderline cardiomegaly. Central vascular congestion without convincing pulmonary edema. Study is limited by poor inspiration. No segmental infiltrate.  IMPRESSION: Limited study by poor inspiration. Central vascular congestion without convincing pulmonary edema. No segmental infiltrate.   Electronically Signed   By: Natasha MeadLiviu  Pop M.D.   On: 01/31/2015 08:51   Dg Thoracic Spine 2 View  01/31/2015   CLINICAL DATA:  Fall, upper back pain  EXAM: THORACIC SPINE - 2 VIEW  COMPARISON:  None.  FINDINGS: Three views of thoracic spine submitted. No acute fracture or subluxation degenerative changes are noted mid and lower thoracic spine. Alignment and vertebral body heights are preserved.  IMPRESSION: No acute fracture or subluxation. Degenerative changes mid and lower thoracic spine.   Electronically Signed   By: Natasha MeadLiviu  Pop M.D.   On: 01/31/2015 08:52   Dg Pelvis 1-2 Views  01/31/2015   CLINICAL DATA:  Pain following trauma/fall  EXAM: PELVIS - 1-2 VIEW  COMPARISON:  None.  FINDINGS: Contrast is seen in the distal ureters and urinary bladder. There is no demonstrable fracture or dislocation. Joint spaces appear intact. No erosive change. 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  IMPRESSION: No fracture or dislocation. 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 Joint spaces appear intact.   Electronically Signed   By: Bretta Bang III M.D.   On: 01/31/2015 08:51   Ct Head Wo Contrast  01/31/2015   CLINICAL DATA:  Code stroke, found on the floor, right side weakness  EXAM: CT HEAD WITHOUT CONTRAST  TECHNIQUE: Contiguous  axial images were obtained from the base of the skull through the vertex without intravenous contrast.  COMPARISON:  None.  FINDINGS: No skull fracture is noted. There is mucosal thickening with complete opacification of the left maxillary sinus. There is thickening of bony wall of the left maxillary sinus. Mucosal thickening with partial opacification left ethmoid air cells. Mucosal thickening left sphenoid sinus. Mucosal thickening with complete opacification left frontal sinus.  No intracranial hemorrhage, mass effect or midline shift. No definite acute cortical infarction. No mass lesion is noted on this unenhanced scan. Mild cerebral atrophy.  IMPRESSION: No definite acute cortical infarction. Mild cerebral atrophy. No intracranial hemorrhage, mass effect or midline shift. Mucosal thickening with complete opacification of the left maxillary sinus. Thickening of bony wall of left maxillary sinus. Mucosal thickening with partial opacification left ethmoid air cells. Mucosal thickening left sphenoid sinus. Complete opacification left frontal sinus.  These results were called by telephone at the time of interpretation on 01/31/2015 at 8:16 am to Dr. Thad Ranger, who verbally acknowledged these results.   Electronically Signed   By: Natasha Mead M.D.   On: 01/31/2015 08:17    Assessment: 64 y.o. male presenting with a recurrent episode of right sided numbness and weakness.  Symptoms resolving with the most prominent symptom at this time being right sided numbness.  Head CT personally reviewed and shows no acute changes.  CTA performed as well and reviewed with radiology.  It shows no evidence of proximal occlusions amenable to interventional therapy.  Patient is hypertensive.  Has been noncompliant with medications.  Not a tPA candidate due to being outside treatment window.  Further work up recommended.    Stroke Risk Factors - hypertension and smoking  Plan: 1. HgbA1c, fasting lipid panel 2. MRI of the brain  without contrast 3. PT consult, OT consult, Speech consult 4. Echocardiogram 5. Carotid dopplers 6. Prophylactic therapy-ASA  daily 7. NPO until RN stroke swallow screen 8. Telemetry monitoring 9. Frequent neuro checks 10. Permissive BP control  Case discussed with Dr. Geanie Berlin, MD [facility] [phone] 01/31/2015, 9:08 AM

## 2015-01-31 NOTE — ED Notes (Signed)
MD Reynolds at bedside with the patient.

## 2015-01-31 NOTE — Code Documentation (Signed)
64 year old male [facility].  Wife reports per EMS that he was up and having normal AM when around 0530 he suddenly had right side weakness and fell to the ground - he was unable to speak.  EMS reports he was flacid on the right  and unable to speak - patient began to improve in route to hospital.   On arrival to ED he was alert - moving right side - speech intact but slow to respond - and complained of right side numbness.  On patient exam he reports that he woke this AM with right side face arm and leg numbness.  LSW last pm at 1100 pm when he went to bed.   He later reported to us that he had a similar episode yesterday at 1 pm  with right side going flacid - he stated he sat down - did not fall - and it resolved within 45 minutes - he did not seek medical attention.  NIHHS 02 right side numbness and slight right facial droop.  Wife arrives and reports she did not know about the event yesterday - she states when she got up this morning  he was in the TV room watching TV but she did not interact with him- she was busy getting 2 grandchildren ready for school.  He walked into the room where she was around 0530 and had the falling episode - right side not moving and could not speak. She last interacted with him last pm at 10 pm when he was getting the shower - all was well.  She reports he was on BP meds at one time but stopped them on his own.   His symptoms remain here right side numbness and slight facial droop.  Speech is clear.  C/o pain right leg and lower back - EDP aware.  Dr. Thad Rangereynolds present - speaking with wife and patient - no acute treatment.  Handoff to American FinancialKaren RN.

## 2015-01-31 NOTE — ED Notes (Signed)
Family at bedside. 

## 2015-02-01 DIAGNOSIS — I10 Essential (primary) hypertension: Secondary | ICD-10-CM

## 2015-02-01 DIAGNOSIS — Z8719 Personal history of other diseases of the digestive system: Secondary | ICD-10-CM

## 2015-02-01 DIAGNOSIS — G459 Transient cerebral ischemic attack, unspecified: Secondary | ICD-10-CM | POA: Diagnosis not present

## 2015-02-01 DIAGNOSIS — R531 Weakness: Secondary | ICD-10-CM

## 2015-02-01 DIAGNOSIS — R2990 Unspecified symptoms and signs involving the nervous system: Secondary | ICD-10-CM

## 2015-02-01 DIAGNOSIS — F17290 Nicotine dependence, other tobacco product, uncomplicated: Secondary | ICD-10-CM

## 2015-02-01 LAB — LIPID PANEL
CHOLESTEROL: 162 mg/dL (ref 0–200)
HDL: 42 mg/dL (ref >39)
LDL Cholesterol: 89 mg/dL (ref 0–99)
Total CHOL/HDL Ratio: 3.9 RATIO
Triglycerides: 153 mg/dL — ABNORMAL HIGH (ref <150)
VLDL: 31 mg/dL (ref 0–40)

## 2015-02-01 LAB — TROPONIN I: Troponin I: <0.03 ng/mL (ref <0.031)

## 2015-02-01 MED ORDER — ATORVASTATIN CALCIUM 10 MG PO TABS
20.0000 mg | ORAL_TABLET | Freq: Every day | ORAL | Status: DC
  Administered 2015-02-01 – 2015-02-02 (×2): 20 mg via ORAL
  Filled 2015-02-01 (×2): qty 2

## 2015-02-01 MED ORDER — ACETAMINOPHEN 325 MG PO TABS
650.0000 mg | ORAL_TABLET | Freq: Four times a day (QID) | ORAL | Status: DC | PRN
  Administered 2015-02-01: 650 mg via ORAL
  Filled 2015-02-01: qty 2

## 2015-02-01 NOTE — Evaluation (Signed)
Physical Therapy Evaluation Patient Details Name: Alexander Anthony MRN: [id number] DOB: 1950/12/16 Today's Date: 02/01/2015   History of Present Illness  Patient is a 64 y/o male with PMH of HTN (noncompliant with medications) and tobacco abuse presents with R sided weakness and aphasia, s/p fall at home. Code stroke called upon arrival to ED. NIHSS was 1. CT head and MRI head unremarkable. Workup pending.    Clinical Impression  Patient presents with mild strength/sensation deficits RLE. Tolerated community distance ambulation while performing higher level balance challenges with only minor deviations in gait however no overt LOB. Pt very aware of deficits and able to compensate appropriately. Education provided on warning signs/symptoms of stroke. Instructed pt to call MD for OPPT consult if symptoms do not resolve or worsen when at home. Encourage ambulation with RN for safety while in hospital. Discharge from therapy.    Follow Up Recommendations Supervision - Intermittent;No PT follow up    Equipment Recommendations  None recommended by PT    Recommendations for Other Services       Precautions / Restrictions Precautions Precautions: Fall Restrictions Weight Bearing Restrictions: No      Mobility  Bed Mobility Overal bed mobility: Needs Assistance Bed Mobility: Supine to Sit     Supine to sit: Independent        Transfers Overall transfer level: Needs assistance Equipment used: None Transfers: Sit to/from Stand Sit to Stand: Supervision         General transfer comment: Supervision for safety due to first time being up and reported weakness.  Ambulation/Gait Ambulation/Gait assistance: Supervision Ambulation Distance (Feet): 500 Feet Assistive device: None Gait Pattern/deviations: Step-through pattern;Decreased stride length;Decreased stance time - left   Gait velocity interpretation: Below normal speed for age/gender General Gait Details: Pt with slow,  guarded gait secondary to weakness RLE. Mild knee instability noted but no knee buckling or LOB. Tolerated higher level balance challenges. See balance section.  Stairs            Wheelchair Mobility    Modified Rankin (Stroke Patients Only)       Balance Overall balance assessment: Needs assistance Sitting-balance support: Feet supported;No upper extremity supported Sitting balance-Leahy Scale: Good Sitting balance - Comments: Able to donn socks sitting EOB reaching outside BoS.   Standing balance support: During functional activity Standing balance-Leahy Scale: Good               High level balance activites: Side stepping;Backward walking;Direction changes;Turns;Sudden stops;Head turns High Level Balance Comments: Tolerated above challenges with only minor deviations in gait, no overt LOB. Able to change gait speeds and step over objects without difficulty.              Pertinent Vitals/Pain Pain Assessment: No/denies pain    Home Living Family/patient expects to be discharged to:: Private residence Living Arrangements: Spouse/significant other Available Help at Discharge: Family;Available 24 hours/day Type of Home: House Home Access: Level entry     Home Layout: One level Home Equipment: Cane - single point      Prior Function Level of Independence: Independent               Hand Dominance   Dominant Hand: Right    Extremity/Trunk Assessment   Upper Extremity Assessment: Defer to OT evaluation;Overall [facility] for tasks assessed           Lower Extremity Assessment: RLE deficits/detail RLE Deficits / Details: Decreased functional strength noted, grossly ~4/5 throughout.  Communication   Communication: Expressive difficulties (Reports speech is not completely back to baseline but is "getting there", "still slow sometimes.")  Cognition Arousal/Alertness: Awake/alert Behavior During Therapy: WFL for tasks assessed/performed Overall  Cognitive Status: Within Functional Limits for tasks assessed                      General Comments      Exercises        Assessment/Plan    PT Assessment Patent does not need any further PT services  PT Diagnosis Generalized weakness   PT Problem List    PT Treatment Interventions     PT Goals (Current goals can be found in the Care Plan section) Acute Rehab PT Goals Patient Stated Goal: to return to independence and enjoy retired life PT Goal Formulation: All assessment and education complete, DC therapy Time For Goal Achievement: 02/15/15 Potential to Achieve Goals: Good    Frequency     Barriers to discharge        Co-evaluation               End of Session Equipment Utilized During Treatment: Gait belt Activity Tolerance: Patient tolerated treatment well Patient left: in bed;with call bell/phone within reach Nurse Communication: Mobility status         Time: 6295-2841 PT Time Calculation (min) (ACUTE ONLY): 20 min   Charges:   PT Evaluation $Initial PT Evaluation Tier I: 1 Procedure     PT G CodesAlvie Anthony A 2015-02-25, 4:01 PM  Alexander Anthony, PT, DPT [phone]

## 2015-02-01 NOTE — Progress Notes (Signed)
Internal Medicine Attending  Date: 02/01/2015  Patient name: Alexander ProseRobert W Anthony Medical record number: [id number] Date of birth: 10/09/1951 Age: 64 y.o. Gender: male  I saw and evaluated the patient. I discussed patient and reviewed the resident's note by Dr. Danella Anthony, and I agree with the resident's findings and plans as documented in her note.  See the attending admission note for my assessment.

## 2015-02-01 NOTE — Progress Notes (Signed)
Subjective: Pt having rt sided temporal HA this morning. Denies weakness and feels like he almost back to baseline. Pt has not been taking asa  regularly.  Objective: Vital signs in last 24 hours: Filed Vitals:   01/31/15 2359 02/01/15 0207 02/01/15 0608 02/01/15 0950  BP: 146/75 139/78 137/81 150/85  Pulse: 57 57 58 59  Temp: 97.7 F (36.5 C) 98.3 F (36.8 C) 98.5 F (36.9 C) 97.7 F (36.5 C)  TempSrc: Oral Oral Oral Oral  Resp: SpO2: 100% 100% 99% 100%   Weight change:  No intake or output data in the 24 hours ending 02/01/15 1125 General: NAD, laying in bed comfortably Lungs: CTAB, no wheezing Cardiac: RRR, no murmurs GI: soft, active bowel sounds Neuro: 5/5 UE and LE strength b/l, decreased sensation on rt forehead and cheek.rt sided facial droop still present.   Lab Results: Basic Metabolic Panel:  Recent Labs Lab 01/31/15 0744 01/31/15 0749  NA 136 139  K 4.0 4.0  CL 105 103  CO2 25  --   GLUCOSE 100* 104*  BUN 13 16  CREATININE 0.88 0.90  CALCIUM 8.9  --    Liver Function Tests:  Recent Labs Lab 01/31/15 0744  AST 22  ALT 21  ALKPHOS 77  BILITOT 0.8  PROT 7.2  ALBUMIN 4.1   CBC:  Recent Labs Lab 01/31/15 0744 01/31/15 0749  WBC 4.7  --   NEUTROABS 2.2  --   HGB 14.5 16.3  HCT 42.6 48.0  MCV 92.0  --   PLT 150  --    CBG:  Recent Labs Lab 01/31/15 0852  GLUCAP 94   Fasting Lipid Panel:  Recent Labs Lab 02/01/15 0441  CHOL 162  HDL 42  LDLCALC 89  TRIG 153*  CHOLHDL 3.9   Thyroid Function Tests:  Recent Labs Lab 01/31/15 1230  TSH 1.145   Coagulation:  Recent Labs Lab 01/31/15 0744  LABPROT 14.0  INR 1.06   Studies/Results: Ct Angio Head W/cm &/or Wo Cm  01/31/2015   CLINICAL DATA:  64 year old male code stroke. Found down with right side weakness. Initial encounter.  EXAM: CT ANGIOGRAPHY HEAD AND NECK  TECHNIQUE: Multidetector CT imaging of the head and neck was performed using the  standard protocol during bolus administration of intravenous contrast. Multiplanar CT image reconstructions and MIPs were obtained to evaluate the vascular anatomy. Carotid stenosis measurements (when applicable) are obtained utilizing NASCET criteria, using the distal internal carotid diameter as the denominator.  CONTRAST:  80mL OMNIPAQUE IOHEXOL 350 MG/ML SOLN  COMPARISON:  Head CT without contrast 0750 hours today.  FINDINGS: CT HEAD  Brain: Stable gray-white matter differentiation. No evidence of cortically based acute infarction identified. No intracranial mass effect.  Calvarium and skull base: Stable and intact.  Paranasal sinuses: Obstructive pattern left paranasal sinusitis with mucoperiosteal thickening. Retained secretions in the left nasal cavity and nasopharynx.  Orbits: Negative.  CTA NECK  Aortic arch: Ectatic arch. Three vessel arch configuration. Tortuous proximal great vessels. No great vessel origin stenosis.  Right carotid system: Negative right CCA aside from mild proximal tortuosity. Soft and calcified plaque at the right carotid bifurcation and right ICA bulb not resulting in stenosis.  Left carotid system: Negative left CCA origin. Proximal left CCA tortuosity. Mild soft and calcified plaque at the left carotid bifurcation and left ICA bulb with no stenosis.  Vertebral arteries:No proximal subclavian arteries stenosis. Both vertebral artery origins appear within normal limits.  Tortuous  proximal vertebral arteries more so the left. Fairly codominant vertebrals. Tortuous distal cervical vertebral arteries, no vertebral artery stenosis in the neck.  Skeleton: No acute osseous abnormality identified. Cervical spine findings are reported separately.  Other neck: Negative lung apices aside from atelectasis. No superior mediastinal lymphadenopathy. Thyroid, larynx, pharynx, parapharyngeal spaces, retropharyngeal space, sublingual space, submandibular glands, and parotid glands are within normal  limits. No cervical lymphadenopathy.  CTA HEAD  Posterior circulation: Codominant distal vertebral arteries are patent without stenosis. Normal PICA origins. Tortuous but otherwise negative vertebrobasilar junction. Tortuous basilar artery without stenosis. SCA and PCA origins are within normal limits. P1 segment tortuosity is specially on the left. Posterior communicating arteries are diminutive or absent. Mild to moderate irregularity of the left P2 segment. Mild to moderate irregularity of the right P3 branches. Preserved bilateral distal PCA flow.  Anterior circulation: Patent ICA siphons with no siphon stenosis. Calcified siphon plaque more so on the left. Ophthalmic artery origins are within normal limits. Tortuous distal siphons and carotid termini. MCA and ACA origins are within normal limits.  Tortuous proximal ACAs. Diminutive or absent anterior communicating artery. Negative ACA branches aside from tortuosity.  Right MCA branches are normal aside from mild irregularity.  Left MCA M1 segment is mildly irregular. Left MCA bifurcation is patent. Left MCA branches are normal aside from mild irregularity.  Venous sinuses: Within normal limits.  Anatomic variants: None.  IMPRESSION: 1. No carotid or anterior circulation hemodynamically significant stenosis or occlusion. 2. Bilateral PCA atherosclerosis, affecting the left P2 and right P3 segments. No major PCA branch occlusion. 3. Stable CT appearance of the brain since 0750 hours with no acute cortically based infarct or acute intracranial hemorrhage identified. 4. Cervical spine CT reported separately. 5. Obstructive pattern left paranasal sinusitis appears to be acute on chronic. Preliminary report of the vascular findings discussed by telephone with Dr. Thana Farr at [phone] hours on 01/31/2015.   Electronically Signed   By: Odessa Fleming M.D.   On: 01/31/2015 09:35   Dg Chest 1 View  01/31/2015   CLINICAL DATA:  Fall, stroke  EXAM: CHEST  1 VIEW  COMPARISON:   None.  FINDINGS: Borderline cardiomegaly. Central vascular congestion without convincing pulmonary edema. Study is limited by poor inspiration. No segmental infiltrate.  IMPRESSION: Limited study by poor inspiration. Central vascular congestion without convincing pulmonary edema. No segmental infiltrate.   Electronically Signed   By: Natasha Mead M.D.   On: 01/31/2015 08:51   Dg Thoracic Spine 2 View  01/31/2015   CLINICAL DATA:  Fall, upper back pain  EXAM: THORACIC SPINE - 2 VIEW  COMPARISON:  None.  FINDINGS: Three views of thoracic spine submitted. No acute fracture or subluxation degenerative changes are noted mid and lower thoracic spine. Alignment and vertebral body heights are preserved.  IMPRESSION: No acute fracture or subluxation. Degenerative changes mid and lower thoracic spine.   Electronically Signed   By: Natasha Mead M.D.   On: 01/31/2015 08:52   Dg Pelvis 1-2 Views  01/31/2015   CLINICAL DATA:  Pain following trauma/fall  EXAM: PELVIS - 1-2 VIEW  COMPARISON:  None.  FINDINGS: Contrast is seen in the distal ureters and urinary bladder. There is no demonstrable fracture or dislocation. Joint spaces appear intact. No erosive change. 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  IMPRESSION: No fracture or dislocation. 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 Joint spaces appear intact.   Electronically Signed   By: Bretta Bang III M.D.   On: 01/31/2015 08:51  Ct Head Wo Contrast  01/31/2015   CLINICAL DATA:  Code stroke, found on the floor, right side weakness  EXAM: CT HEAD WITHOUT CONTRAST  TECHNIQUE: Contiguous axial images were obtained from the base of the skull through the vertex without intravenous contrast.  COMPARISON:  None.  FINDINGS: No skull fracture is noted. There is mucosal thickening with complete opacification of the left maxillary sinus. There is thickening of bony wall of the left maxillary sinus. Mucosal thickening with partial opacification left ethmoid air cells. Mucosal  thickening left sphenoid sinus. Mucosal thickening with complete opacification left frontal sinus.  No intracranial hemorrhage, mass effect or midline shift. No definite acute cortical infarction. No mass lesion is noted on this unenhanced scan. Mild cerebral atrophy.  IMPRESSION: No definite acute cortical infarction. Mild cerebral atrophy. No intracranial hemorrhage, mass effect or midline shift. Mucosal thickening with complete opacification of the left maxillary sinus. Thickening of bony wall of left maxillary sinus. Mucosal thickening with partial opacification left ethmoid air cells. Mucosal thickening left sphenoid sinus. Complete opacification left frontal sinus.  These results were called by telephone at the time of interpretation on 01/31/2015 at 8:16 am to Dr. Thad Ranger, who verbally acknowledged these results.   Electronically Signed   By: Natasha Mead M.D.   On: 01/31/2015 08:17   Ct Angio Neck W/cm &/or Wo/cm  01/31/2015   CLINICAL DATA:  64 year old male code stroke. Found down with right side weakness. Initial encounter.  EXAM: CT ANGIOGRAPHY HEAD AND NECK  TECHNIQUE: Multidetector CT imaging of the head and neck was performed using the standard protocol during bolus administration of intravenous contrast. Multiplanar CT image reconstructions and MIPs were obtained to evaluate the vascular anatomy. Carotid stenosis measurements (when applicable) are obtained utilizing NASCET criteria, using the distal internal carotid diameter as the denominator.  CONTRAST:  80mL OMNIPAQUE IOHEXOL 350 MG/ML SOLN  COMPARISON:  Head CT without contrast 0750 hours today.  FINDINGS: CT HEAD  Brain: Stable gray-white matter differentiation. No evidence of cortically based acute infarction identified. No intracranial mass effect.  Calvarium and skull base: Stable and intact.  Paranasal sinuses: Obstructive pattern left paranasal sinusitis with mucoperiosteal thickening. Retained secretions in the left nasal cavity and  nasopharynx.  Orbits: Negative.  CTA NECK  Aortic arch: Ectatic arch. Three vessel arch configuration. Tortuous proximal great vessels. No great vessel origin stenosis.  Right carotid system: Negative right CCA aside from mild proximal tortuosity. Soft and calcified plaque at the right carotid bifurcation and right ICA bulb not resulting in stenosis.  Left carotid system: Negative left CCA origin. Proximal left CCA tortuosity. Mild soft and calcified plaque at the left carotid bifurcation and left ICA bulb with no stenosis.  Vertebral arteries:No proximal subclavian arteries stenosis. Both vertebral artery origins appear within normal limits.  Tortuous proximal vertebral arteries more so the left. Fairly codominant vertebrals. Tortuous distal cervical vertebral arteries, no vertebral artery stenosis in the neck.  Skeleton: No acute osseous abnormality identified. Cervical spine findings are reported separately.  Other neck: Negative lung apices aside from atelectasis. No superior mediastinal lymphadenopathy. Thyroid, larynx, pharynx, parapharyngeal spaces, retropharyngeal space, sublingual space, submandibular glands, and parotid glands are within normal limits. No cervical lymphadenopathy.  CTA HEAD  Posterior circulation: Codominant distal vertebral arteries are patent without stenosis. Normal PICA origins. Tortuous but otherwise negative vertebrobasilar junction. Tortuous basilar artery without stenosis. SCA and PCA origins are within normal limits. P1 segment tortuosity is specially on the left. Posterior communicating arteries are diminutive or  absent. Mild to moderate irregularity of the left P2 segment. Mild to moderate irregularity of the right P3 branches. Preserved bilateral distal PCA flow.  Anterior circulation: Patent ICA siphons with no siphon stenosis. Calcified siphon plaque more so on the left. Ophthalmic artery origins are within normal limits. Tortuous distal siphons and carotid termini. MCA and  ACA origins are within normal limits.  Tortuous proximal ACAs. Diminutive or absent anterior communicating artery. Negative ACA branches aside from tortuosity.  Right MCA branches are normal aside from mild irregularity.  Left MCA M1 segment is mildly irregular. Left MCA bifurcation is patent. Left MCA branches are normal aside from mild irregularity.  Venous sinuses: Within normal limits.  Anatomic variants: None.  IMPRESSION: 1. No carotid or anterior circulation hemodynamically significant stenosis or occlusion. 2. Bilateral PCA atherosclerosis, affecting the left P2 and right P3 segments. No major PCA branch occlusion. 3. Stable CT appearance of the brain since 0750 hours with no acute cortically based infarct or acute intracranial hemorrhage identified. 4. Cervical spine CT reported separately. 5. Obstructive pattern left paranasal sinusitis appears to be acute on chronic. Preliminary report of the vascular findings discussed by telephone with Dr. Thana Farr at [phone] hours on 01/31/2015.   Electronically Signed   By: Odessa Fleming M.D.   On: 01/31/2015 09:35   Ct Cervical Spine Wo Contrast  01/31/2015   CLINICAL DATA:  64 year old male found down with right side weakness. Code stroke. Initial encounter.  EXAM: CT CERVICAL SPINE WITHOUT CONTRAST  TECHNIQUE: Multidetector CT imaging of the cervical spine was performed without intravenous contrast. Multiplanar CT image reconstructions were also generated.  COMPARISON:  Head CT without contrast 0750 hours today. Head and neck CTA reported separately.  FINDINGS: Straightening of cervical lordosis. Cervicothoracic junction alignment is within normal limits. Visualized skull base is intact. No atlanto-occipital dissociation. Bilateral posterior element alignment is within normal limits. Widespread advanced cervical disc and endplate degeneration, including bulky endplate osteophytosis. No acute cervical spine fracture identified. Grossly intact visualized upper  thoracic levels.  Multifactorial mild to moderate degenerative spinal stenosis at C5-C6 and C6-C7. Moderate to severe lower cervical bilateral neural foraminal stenosis. Negative paraspinal soft tissues.  IMPRESSION: 1. No acute fracture or listhesis identified in the cervical spine. Ligamentous injury is not excluded. 2. CTA Head And Neck reported separately. 3. Lower cervical spine degeneration with mild to moderate degenerative spinal stenosis and moderate to severe degenerative biforaminal stenosis.   Electronically Signed   By: Odessa Fleming M.D.   On: 01/31/2015 09:40   Mr Brain Wo Contrast  01/31/2015   CLINICAL DATA:  64 year old male found down with right side weakness. Code stroke. Initial encounter.  EXAM: MRI HEAD WITHOUT CONTRAST  MRA HEAD WITHOUT CONTRAST  TECHNIQUE: Multiplanar, multiecho pulse sequences of the brain and surrounding structures were obtained without intravenous contrast. Angiographic images of the head were obtained using MRA technique without contrast.  COMPARISON:  CTA head and neck from today reported separately.  FINDINGS: MRI HEAD FINDINGS  Bulky dural calcifications with mild susceptibility artifact. No restricted diffusion or evidence of acute infarction.  Major intracranial vascular flow voids are preserved. No midline shift, mass effect, evidence of mass lesion, ventriculomegaly, extra-axial collection or acute intracranial hemorrhage. Cervicomedullary junction and pituitary are within normal limits. Negative visualized cervical spine.  Evidence of a chronic micro hemorrhage in the inferior right cerebellum is (series 9, image 12) with susceptibility artifact. Elsewhere minimal nonspecific cerebral white matter T2 and FLAIR hyperintensity the extent of which  is within normal limits for age. No cortical encephalomalacia identified.  Visible internal auditory structures appear normal. Mastoids are clear. Negative orbits soft tissues. Bone marrow signal within normal limits.  Visualized scalp soft tissues are within normal limits.  Inspissated secretions opacifying/obstructing the left side paranasal sinuses. The left sphenoid sinus is relatively spared. The right paranasal sinuses are clear.  MRA HEAD FINDINGS  Antegrade flow in the posterior circulation with tortuous vertebrobasilar junction. No distal vertebral artery stenosis and normal PICA origins. No basilar artery stenosis. SCA and PCA origins are within normal limits. Tortuous proximal PCAs more so the left. Bilateral P2 and to a lesser extent P 3 PCA irregularity compatible with atherosclerosis. Bilateral preserved distal flow with no high-grade PCA stenosis identified. Posterior communicating arteries are diminutive or absent.  Antegrade flow in both ICA siphons. Tortuous siphons and carotid termini with irregularity but no stenosis. Ophthalmic artery origins are within normal limits. MCA and ACA origins are patent. Proximal ACA is are tortuous. Anterior communicating artery is diminutive or absent. Negative visualized bilateral ACA branches.  Both MCA M1 segments and MCA bifurcations are patent. M1 segment tortuosity and irregularity bilaterally. No major MCA branch occlusion or focal stenosis identified.  IMPRESSION: 1. No acute intracranial abnormality. Largely unremarkable for age non contrast MRI appearance of the brain. 2. Tortuous intracranial arteries with atherosclerosis but no hemodynamically significant intracranial stenosis or major circle of Willis branch occlusion identified. 3. Opacified and obstructed left paranasal sinuses with inspissated secretions/material.   Electronically Signed   By: Odessa FlemingH  Hall M.D.   On: 01/31/2015 11:54   Mr Maxine GlennMra Head/brain Wo Cm  01/31/2015   CLINICAL DATA:  64 year old male found down with right side weakness. Code stroke. Initial encounter.  EXAM: MRI HEAD WITHOUT CONTRAST  MRA HEAD WITHOUT CONTRAST  TECHNIQUE: Multiplanar, multiecho pulse sequences of the brain and surrounding  structures were obtained without intravenous contrast. Angiographic images of the head were obtained using MRA technique without contrast.  COMPARISON:  CTA head and neck from today reported separately.  FINDINGS: MRI HEAD FINDINGS  Bulky dural calcifications with mild susceptibility artifact. No restricted diffusion or evidence of acute infarction.  Major intracranial vascular flow voids are preserved. No midline shift, mass effect, evidence of mass lesion, ventriculomegaly, extra-axial collection or acute intracranial hemorrhage. Cervicomedullary junction and pituitary are within normal limits. Negative visualized cervical spine.  Evidence of a chronic micro hemorrhage in the inferior right cerebellum is (series 9, image 12) with susceptibility artifact. Elsewhere minimal nonspecific cerebral white matter T2 and FLAIR hyperintensity the extent of which is within normal limits for age. No cortical encephalomalacia identified.  Visible internal auditory structures appear normal. Mastoids are clear. Negative orbits soft tissues. Bone marrow signal within normal limits. Visualized scalp soft tissues are within normal limits.  Inspissated secretions opacifying/obstructing the left side paranasal sinuses. The left sphenoid sinus is relatively spared. The right paranasal sinuses are clear.  MRA HEAD FINDINGS  Antegrade flow in the posterior circulation with tortuous vertebrobasilar junction. No distal vertebral artery stenosis and normal PICA origins. No basilar artery stenosis. SCA and PCA origins are within normal limits. Tortuous proximal PCAs more so the left. Bilateral P2 and to a lesser extent P 3 PCA irregularity compatible with atherosclerosis. Bilateral preserved distal flow with no high-grade PCA stenosis identified. Posterior communicating arteries are diminutive or absent.  Antegrade flow in both ICA siphons. Tortuous siphons and carotid termini with irregularity but no stenosis. Ophthalmic artery origins are  within normal limits. MCA  and ACA origins are patent. Proximal ACA is are tortuous. Anterior communicating artery is diminutive or absent. Negative visualized bilateral ACA branches.  Both MCA M1 segments and MCA bifurcations are patent. M1 segment tortuosity and irregularity bilaterally. No major MCA branch occlusion or focal stenosis identified.  IMPRESSION: 1. No acute intracranial abnormality. Largely unremarkable for age non contrast MRI appearance of the brain. 2. Tortuous intracranial arteries with atherosclerosis but no hemodynamically significant intracranial stenosis or major circle of Willis branch occlusion identified. 3. Opacified and obstructed left paranasal sinuses with inspissated secretions/material.   Electronically Signed   By: Odessa Fleming M.D.   On: 01/31/2015 11:54   Medications: I have reviewed the patient's current medications. Scheduled Meds: . aspirin  300 mg Rectal Daily   Or  . aspirin  325 mg Oral Daily  . heparin  5,000 Units Subcutaneous 3 times per day  . Influenza vac split quadrivalent PF  0.5 mL Intramuscular Tomorrow-1000   Continuous Infusions:  PRN Meds:.acetaminophen, senna-docusate Assessment/Plan: Principal Problem:   Stroke-like symptoms Active Problems:   HTN (hypertension)   Tobacco abuse   Blood per rectum   Rt sided weakness-- could be TIA as head imaging negative however pt still has residual sensory deficit on rt side of face and rt sided facial droop. Thus could also be a small stroke not detected on imaging. CTA neck neg for significant carotid stenosis or occlusion.  - TTE pending - PT/OT/SLP -  A1c pending -  LDL 89 w/ goal less than 70, started on lipitor   - asa  daily -TSH and UDS WNL  HTN: Pt with a h/o HTN and medication non-compliance. BP normotensive for age this morning ( 150/85). Previously on losartan.  - can restart BP meds if BP elevated.   Tobacco abuse: Pt smokes 2-4 cigars a day and has since 1980.  - stressed  importance of smoking cessation this morning, pt understands - Consulting CSW for cessation  Hx of blood per rectum: hemoglobin WNL at 16.3 - FOBT once able - Pt will need outpatient colonoscopy  Likely BPH: Pt with urinary frequency and hesitancy with urination. Increased nocturia. He likely has BPH given his age. UA negative - can start flomax this admission if symptomatic.   #DVT PPx: [place] Heparin  Dispo: Disposition is deferred at this time, awaiting improvement of current medical problems. Anticipated discharge in approximately 1-2 day(s).   The patient does not have a current PCP (No primary care provider on file.) and may need an [facility] hospital follow-up appointment after discharge.  The patient does not have transportation limitations that hinder transportation to clinic appointments  .Services Needed at time of discharge: Y = Yes, Blank = No PT:   OT:   RN:   Equipment:   Other:     LOS: 1 day   Gara Kroner, MD 02/01/2015, 11:25 AM

## 2015-02-01 NOTE — Progress Notes (Signed)
PT Cancellation Note  Patient Details Name: Alexander Anthony MRN: [id number] DOB: [birth year]   Cancelled Treatment:    Reason Eval/Treat Not Completed: Medical issues which prohibited therapy Pt on bedrest with HOB flat. Will await increase in activity orders prior to PT evaluation.   Alvie HeidelbergFolan, Kavish Lafitte A 02/01/2015, 8:59 AM Alvie HeidelbergShauna Folan, PT, DPT [phone]

## 2015-02-01 NOTE — Progress Notes (Signed)
2D Echocardiogram Complete.  02/01/2015   Caylan Schifano, RDCS  

## 2015-02-01 NOTE — Evaluation (Signed)
Occupational Therapy Evaluation and Discharge Patient Details Name: Alexander ProseRobert W Anthony MRN: [id number] DOB: Oct 16, 1951 Today's Date: 02/01/2015    History of Present Illness Patient is a 64 y/o male with PMH of HTN (noncompliant with medications) and tobacco abuse presents with R sided weakness and aphasia, s/p fall at home. Code stroke called upon arrival to ED. NIHSS was 1. CT head and MRI head unremarkable. Workup pending.   Clinical Impression   PTA pt lived at home and was independent with ADLs. Pt currently at Mod I level with mild decreased strength and sensation in RLE. RUE WFL. Educated pt on fall prevention. No further acute OT needs.     Follow Up Recommendations  No OT follow up;Supervision - Intermittent    Equipment Recommendations  None recommended by OT    Recommendations for Other Services       Precautions / Restrictions Precautions Precautions: Fall Restrictions Weight Bearing Restrictions: No      Mobility Bed Mobility Overal bed mobility: Modified Independent Bed Mobility: Supine to Sit     Supine to sit: Independent        Transfers Overall transfer level: Modified independent Equipment used: None Transfers: Sit to/from Stand Sit to Stand: Supervision         General transfer comment: Supervision for safety due to first time being up and reported weakness.    Balance Overall balance assessment: No apparent balance deficits (not formally assessed) Sitting-balance support: Feet supported;No upper extremity supported Sitting balance-Leahy Scale: Good Sitting balance - Comments: Able to donn socks sitting EOB reaching outside BoS.   Standing balance support: During functional activity Standing balance-Leahy Scale: Good               High level balance activites: Side stepping;Backward walking;Direction changes;Turns;Sudden stops;Head turns High Level Balance Comments: Tolerated above challenges with only minor deviations in gait, no  overt LOB. Able to change gait speeds and step over objects without difficulty.             ADL Overall ADL's : Modified independent                                       General ADL Comments: Pt compensated well for RLE weakness and ambulated to bathroom to wash hands at sink. No visual changes and UEs WFL. Pt reports RLE deficits >RUE.      Vision  Pt reports no change from baseline. Pt does report that he has decreased vision at baseline and "needs glasses."          Pertinent Vitals/Pain Pain Assessment: No/denies pain     Hand Dominance Right   Extremity/Trunk Assessment Upper Extremity Assessment Upper Extremity Assessment: Overall WFL for tasks assessed   Lower Extremity Assessment Lower Extremity Assessment: Defer to PT evaluation RLE Deficits / Details: Decreased functional strength noted, grossly ~4/5 throughout. RLE Sensation: decreased light touch   Cervical / Trunk Assessment Cervical / Trunk Assessment: Normal   Communication Communication Communication: Expressive difficulties (mild dysnomia)   Cognition Arousal/Alertness: Awake/alert Behavior During Therapy: WFL for tasks assessed/performed Overall Cognitive Status: Within Functional Limits for tasks assessed                                Home Living Family/patient expects to be discharged to:: Private residence Living Arrangements: Spouse/significant other Available Help at  Discharge: Family;Available 24 hours/day Type of Home: House Home Access: Level entry     Home Layout: One level               Home Equipment: Cane - single point      Lives With: Spouse    Prior Functioning/Environment Level of Independence: Independent             OT Diagnosis: Generalized weakness    End of Session  Activity Tolerance: Patient tolerated treatment well Patient left: in bed;with call bell/phone within reach   Time: 4098-1191 OT Time Calculation (min): 15  min Charges:  OT General Charges $OT Visit: 1 Procedure OT Evaluation $Initial OT Evaluation Tier I: 1 Procedure G-Codes:    Rae Lips 02/21/15, 4:34 PM  Carney Living, OTR/L Occupational Therapist [phone] (pager)

## 2015-02-01 NOTE — Evaluation (Signed)
Speech Language Pathology Evaluation Patient Details Name: Donald ProseRobert W Sarinana MRN: [id number] DOB: 1951/02/15 Today's Date: 02/01/2015 Time: 1151-1206 SLP Time Calculation (min) (ACUTE ONLY): 15 min  Problem List:  Patient Active Problem List   Diagnosis Date Noted  . Stroke-like symptoms 01/31/2015  . HTN (hypertension) 01/31/2015  . Tobacco abuse 01/31/2015  . Blood per rectum 01/31/2015   Past Medical History:  Past Medical History  Diagnosis Date  . Hypertension   . Sinusitis    Past Surgical History:  Past Surgical History  Procedure Laterality Date  . Knee surgery      Right  . Carpal tunnel release     HPI:  64 yo w/ PMH HTN (noncompliant with medications), tobacco abuse admitted with R sided weakness and aphasia. He states that the symptoms began yesterday with numbness and weakness in his right face, hand, and leg, and he then developed slurred speech. MRI no acute intracranial abnormality.   Assessment / Plan / Recommendation Clinical Impression  Pt describes dysnomia yesterday during onet of symptoms. Pt states this has returned to baseline and conversational speech is intelligible. Cognition assessed to be WFL's. Working memory was adequate for [employer] CorporationCogn[employer]at subtest, however educated to utilize writing as compensatory strategy if needed (he "keeps it all in his head"). No ST needed at this time.    SLP Assessment  Patient does not need any further Speech Lanaguage Pathology Services    Follow Up Recommendations  None    Frequency and Duration        Pertinent Vitals/Pain Pain Assessment: No/denies pain   SLP Goals     SLP Evaluation Prior Functioning  Cognitive/Linguistic Baseline: Within functional limits  Lives With: Spouse Vocation: Retired   IT consultantCognition  Overall Cognitive Status: Within Functional Limits for tasks assessed Safety/Judgment: Appears intact    Comprehension  Auditory Comprehension Overall Auditory Comprehension: Appears within functional  limits for tasks assessed Visual Recognition/Discrimination Discrimination: Not tested Reading Comprehension Reading Status: Not tested    Expression Expression Primary Mode of Expression: Verbal Verbal Expression Overall Verbal Expression: Appears within functional limits for tasks assessed Written Expression Dominant Hand: Right Written Expression: Not tested   Oral / Motor Oral Motor/Sensory Function Overall Oral Motor/Sensory Function: Impaired Labial ROM: Reduced right Labial Symmetry: Abnormal symmetry right Labial Strength: Within Functional Limits Labial Sensation: Within Functional Limits Lingual ROM: Within Functional Limits Lingual Symmetry: Within Functional Limits Lingual Strength: Within Functional Limits Facial ROM: Within Functional Limits Facial Symmetry: Within Functional Limits Mandible: Within Functional Limits Motor Speech Overall Motor Speech: Appears within functional limits for tasks assessed Intelligibility: Intelligible Motor Planning: Witnin functional limits   GO     Royce MacadamiaLitaker, Janus Vlcek Willis 02/01/2015, 1:31 PM  Breck CoonsLisa Willis Lonell FaceLitaker M.Ed [employer]-SLP Pager [phone]

## 2015-02-01 NOTE — Progress Notes (Signed)
OT Cancellation Note  Patient Details Name: Donald ProseRobert W Dorsch MRN: [id number] DOB: November 08, 1951   Cancelled Treatment:    Reason Eval/Treat Not Completed: Patient not medically ready. Pt has active bedrest orders. Please update activity orders when pt is medically ready. OT will follow up when appropriate.   Nena JordanMiller, Juliannah Ohmann M   Carney LivingLeeAnn Marie Hodaya Curto, OTR/L Occupational Therapist [phone] (pager)  02/01/2015, 1:23 PM

## 2015-02-01 NOTE — Progress Notes (Addendum)
STROKE TEAM PROGRESS NOTE   HISTORY  Alexander Anthony is a 64 y.o.pmh tobacco abuse, HTN who reports that he awakened on Monday with numbness affecting right face, arm, and leg.  At this time he had difficulty with speech as well and inability to use the right side of body.   He had resolution after 1-1.5 hours.  Then on Tuesday similar symptoms returned.  He also had trouble holding objects and wasn't able to hold objects.  He felll with LOC x 2 minutes until EMS arrived.  He also reports no h/a initially Monday with sx's and then noted h/a later in the day.  He also reports his right face and leg hurting and having trouble speaking.  The words would not come out x 30 minutes.  He denies h/o migraines but does have h/o sinus issues.  He has FH migraines (sister).  UDS negative.  Initial NIHSS was 1   Date last known well: Date: 01/30/2015 Time last known well: Time: 23:00 tPA Given: No: Improvement in symptoms     SUBJECTIVE (INTERVAL HISTORY) His sister is at the bedside.  Overall he feels his condition is improved. He denies h/a currently   OBJECTIVE Temp:  [97.2 F (36.2 C)-98.5 F (36.9 C)] 98.5 F (36.9 C) (02/24 0608) Pulse Rate:  [55-69] 58 (02/24 0608) Cardiac Rhythm:  [-] Heart block (02/24 0100) Resp:  [12-21] 18 (02/24 0608) BP: (137-183)/(75-101) 137/81 mmHg (02/24 0608) SpO2:  [96 %-100 %] 99 % (02/24 0608)   Recent Labs Lab 01/31/15 0852  GLUCAP 94    Recent Labs Lab 01/31/15 0744 01/31/15 0749  NA 136 139  K 4.0 4.0  CL 105 103  CO2 25  --   GLUCOSE 100* 104*  BUN 13 16  CREATININE 0.88 0.90  CALCIUM 8.9  --     Recent Labs Lab 01/31/15 0744  AST 22  ALT 21  ALKPHOS 77  BILITOT 0.8  PROT 7.2  ALBUMIN 4.1    Recent Labs Lab 01/31/15 0744 01/31/15 0749  WBC 4.7  --   NEUTROABS 2.2  --   HGB 14.5 16.3  HCT 42.6 48.0  MCV 92.0  --   PLT 150  --    No results for input(s): CKTOTAL, CKMB, CKMBINDEX, TROPONINI in the last 168  hours.  Recent Labs  01/31/15 0744  LABPROT 14.0  INR 1.06    Recent Labs  01/31/15 0958  COLORURINE YELLOW  LABSPEC 1.033*  PHURINE 7.0  GLUCOSEU NEGATIVE  HGBUR NEGATIVE  BILIRUBINUR NEGATIVE  KETONESUR NEGATIVE  PROTEINUR NEGATIVE  UROBILINOGEN 1.0  NITRITE NEGATIVE  LEUKOCYTESUR NEGATIVE       Component Value Date/Time   CHOL 162 02/01/2015 0441   TRIG 153* 02/01/2015 0441   HDL 42 02/01/2015 0441   CHOLHDL 3.9 02/01/2015 0441   VLDL 31 02/01/2015 0441   LDLCALC 89 02/01/2015 0441   No results found for: HGBA1C    Component Value Date/Time   LABOPIA NONE DETECTED 01/31/2015 0958   COCAINSCRNUR NONE DETECTED 01/31/2015 0958   LABBENZ NONE DETECTED 01/31/2015 0958   AMPHETMU NONE DETECTED 01/31/2015 0958   THCU NONE DETECTED 01/31/2015 0958   LABBARB NONE DETECTED 01/31/2015 0958     Recent Labs Lab 01/31/15 0742  ETH <5    Ct Angio Head W/cm &/or Wo Cm 01/31/2015    1. No carotid or anterior circulation hemodynamically significant stenosis or occlusion.  2. Bilateral PCA atherosclerosis, affecting the left P2 and right P3  segments. No major PCA branch occlusion.  3. Stable CT appearance of the brain since 0750 hours with no acute cortically based infarct or acute intracranial hemorrhage identified.  4. Cervical spine CT reported separately.  5. Obstructive pattern left paranasal sinusitis appears to be acute on chronic.    Dg Chest 1 View 01/31/2015    Limited study by poor inspiration. Central vascular congestion without convincing pulmonary edema. No segmental infiltrate.      Dg Thoracic Spine 2 View 01/31/2015    No acute fracture or subluxation. Degenerative changes mid and lower thoracic spine.      Dg Pelvis 1-2 Views 01/31/2015    No fracture or dislocation. 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 Joint spaces appear intact.      Ct Head Wo Contrast 01/31/2015    No definite acute cortical infarction. Mild cerebral atrophy. No intracranial  hemorrhage, mass effect or midline shift. Mucosal thickening with complete opacification of the left maxillary sinus. Thickening of bony wall of left maxillary sinus. Mucosal thickening with partial opacification left ethmoid air cells. Mucosal thickening left sphenoid sinus. Complete opacification left frontal sinus.     Ct Angio Neck W/cm &/or Wo/cm 01/31/2015    1. No carotid or anterior circulation hemodynamically significant stenosis or occlusion.  2. Bilateral PCA atherosclerosis, affecting the left P2 and right P3 segments. No major PCA branch occlusion.  3. Stable CT appearance of the brain since 0750 hours with no acute cortically based infarct or acute intracranial hemorrhage identified.  4. Cervical spine CT reported separately.  5. Obstructive pattern left paranasal sinusitis appears to be acute on chronic.    Ct Cervical Spine Wo Contrast  01/31/2015    1. No acute fracture or listhesis identified in the cervical spine. Ligamentous injury is not excluded.  2. CTA Head And Neck reported separately.  3. Lower cervical spine degeneration with mild to moderate degenerative spinal stenosis and moderate to severe degenerative biforaminal stenosis.     Mr Brain Wo Contrast 01/31/2015    1. No acute intracranial abnormality. Largely unremarkable for age non contrast MRI appearance of the brain.  2. Tortuous intracranial arteries with atherosclerosis but no hemodynamically significant intracranial stenosis or major circle of Willis branch occlusion identified.  3. Opacified and obstructed left paranasal sinuses with inspissated secretions/material.      Mr Maxine Glenn Head/brain Wo Cm 01/31/2015    1. No acute intracranial abnormality. Largely unremarkable for age non contrast MRI appearance of the brain.  2. Tortuous intracranial arteries with atherosclerosis but no hemodynamically significant intracranial stenosis or major circle of Willis branch occlusion identified.  3. Opacified and  obstructed left paranasal sinuses with inspissated secretions/material.     PHYSICAL EXAM General: African American male lying in bed, sister at bedside Mental Status: Alert, oriented x3, thought content appropriate.  Speech fluent without evidence of aphasia.  Able to follow 3 step commands without difficulty. Cranial Nerves: II: Discs flat bilaterally; Visual fields grossly normal, pupils equal, round, reactive to light and accommodation III,IV, VI: ptosis not present, extra-ocular motions intact bilaterally V,VII: smile symmetric, facial light touch sensation normal bilaterally VIII: hearing normal bilaterally IX,X: gag reflex present XI: bilateral shoulder shrug XII: midline tongue extension without atrophy or fasciculations  Motor: Right : Upper extremity   5/5    Left:     Upper extremity   5/5  Lower extremity   5/5     Lower extremity   5/5 Tone and bulk:normal tone throughout; no atrophy noted Sensory: light  touch intact throughout, bilaterally Deep Tendon Reflexes:  Not assessed   Plantars: Right: downgoing   Left: downgoing Cerebellar: normal finger-to-nose,  normal alternating hand movements  Gait: not assessed  CV: pulses palpable throughout       ASSESSMENT/PLAN Mr. Donald ProseRobert W Heldman is a 64 y.o. male with history of right sided numbness, LOC, fall, speech difficulty presenting with likely left hemispheric TIA vs complicated migraine. He did not receive IV t-PA due to improvement of symptoms.   Left hemispheric TIA  secondary to b/l PCA atherosclerosis vs complicated migraine:  Resultant  Resolution of right sided numbness and speech difficulty   2/23 CT head No definite acute cortical infarction. Mild cerebral atrophy. No intracranial hemorrhage, mass effect or midline shift. Mucosal thickening with complete opacification of the left maxillary sinus. Thickening of bony wall of left maxillary sinus. Mucosal thickening with partial opacification left ethmoid air  cells. Mucosalthickening left sphenoid sinus. Complete opacification left frontal sinus.  MRI/A 2/23 No acute intracranial abnormality. Largely unremarkable for age non contrast MRI appearance of the brain. Tortuous intracranial arteries with atherosclerosis but no hemodynamically significant intracranial stenosis or major circle of Willis branch occlusion identified.  Opacified and obstructed left paranasal sinuses with inspissated secretions/material.   2/23 CTA head/neck/cervical spine No carotid or anterior circulation hemodynamically significant stenosis or occlusion. Bilateral PCA atherosclerosis, affecting the left P2 and right P3 segments. No major PCA branch occlusion. Stable CT appearance of the brain since 0750 hours with no acute cortically based infarct or acute intracranial hemorrhage identified. Obstructive pattern left paranasal sinusitis appears to be acute on chronic. No acute fracture or listhesis identified in the cervical spine. Ligamentous injury is not excluded. CTA Head And Neck reported separately. Lower cervical spine degeneration with mild to moderate degenerative spinal stenosis and moderate to severe degenerative biforaminal stenosis.   Carotid Doppler  Cancelled   2D Echo  pending  LDL 89 2/24   HgbA1c pending pending  Heparin for VTE prophylaxis  Diet Heart   aspirin 81 mg orally every day prior to admission, now on aspirin 325 mg orally every day  Patient counseled to be compliant with his antithrombotic medications, smoking cessation   Ongoing aggressive stroke risk factor management  Recommend Aspirin 325 mg qd, add Lipitor 10 or 20 mg, smoking cessation   Therapy recommendations:  PT, OT  Disposition:  Per PT, OT, primary team   Hypertension  Home meds:   None, rec add medication for BP control  Unstable  Patient counseled to be compliant with his blood pressure medications  Hyperlipidemia  Home meds:  none resumed in hospital  LDL 89,  goal < 70  Add Lipitor 10 or 20 mg qhs  Continue statin at discharge  No history of Diabetes  HgbA1c pending, goal < 7.0  Other Stroke Risk Factors  Advanced age  Cigarette smoker, advised to stop smoking  No Hx stroke/TIA  No Family hx stroke   Intracranial atherosclerosis   FH Migraines  Other Active Problems  Per primary team   Other Pertinent History  Per primary team and see above   Hospital day # 1  Desma Maximracy McLean MD  PGY 3 IM    I have personally examined this patient, reviewed notes, independently viewed imaging studies, participated in medical decision making and plan of care. I have made any additions or clarifications directly to the above note. Agree with note above. Patient has had a TIA and remains at recurrent risk for TIAs and strokes.  He needs ongoing stroke evaluation and aggressive risk factor modification. Recommend he start aspirin and Lipitor. He was counseled to quit smoking.  Delia Heady, MD Medical Director [facility] Pager: [phone] 02/01/2015 1:06 PM   02/01/2015, 8:51 AM     To contact Stroke Continuity provider, please refer to [email or web address]. After hours, contact General Neurology

## 2015-02-01 NOTE — Progress Notes (Signed)
Carotid artery duplex order received. CTA head and neck completed 01/31/15, results in [facility]. Please advise if carotid duplex is necessary.   02/01/2015 9:17 AM Gertie FeyMichelle Haydon Dorris, RVT, RDCS, RDMS  Vascular lab- [phone]

## 2015-02-02 DIAGNOSIS — R350 Frequency of micturition: Secondary | ICD-10-CM

## 2015-02-02 DIAGNOSIS — I639 Cerebral infarction, unspecified: Secondary | ICD-10-CM | POA: Insufficient documentation

## 2015-02-02 DIAGNOSIS — R3911 Hesitancy of micturition: Secondary | ICD-10-CM

## 2015-02-02 DIAGNOSIS — R351 Nocturia: Secondary | ICD-10-CM

## 2015-02-02 DIAGNOSIS — K625 Hemorrhage of anus and rectum: Secondary | ICD-10-CM

## 2015-02-02 LAB — HEMOGLOBIN A1C
Hgb A1c MFr Bld: 5.8 % — ABNORMAL HIGH (ref 4.8–5.6)
Mean Plasma Glucose: 120 mg/dL

## 2015-02-02 NOTE — Progress Notes (Signed)
STROKE TEAM PROGRESS NOTE   HISTORY  Alexander ProseRobert W Anthony is a 64 y.o.pmh tobacco abuse, HTN who reports that he awakened on Monday with numbness affecting right face, arm, and leg.  At this time he had difficulty with speech as well and inability to use the right side of body.   He had resolution after 1-1.5 hours.  Then on Tuesday similar symptoms returned.  He also had trouble holding objects and wasn't able to hold objects.  He felll with LOC x 2 minutes until EMS arrived.  He also reports no h/a initially Monday with sx's and then noted h/a later in the day.  He also reports his right face and leg hurting and having trouble speaking.  The words would not come out x 30 minutes.  He denies h/o migraines but does have h/o sinus issues.  He has FH migraines (sister).  UDS negative.  Initial NIHSS was 1   Date last known well: Date: 01/30/2015 Time last known well: Time: 23:00 tPA Given: No: Improvement in symptoms     SUBJECTIVE (INTERVAL HISTORY) His sister is at the bedside.  Overall he feels his condition is improved. He denies h/a currently. He denies h/o syncope this is his 1st episode.     OBJECTIVE Temp:  [98.1 F (36.7 C)-98.7 F (37.1 C)] 98.7 F (37.1 C) (02/25 0100) Pulse Rate:  [56-69] 61 (02/25 0100) Cardiac Rhythm:  [-] Heart block (02/25 0800) Resp:  [18] 18 (02/25 0100) BP: (114-169)/(54-81) 117/64 mmHg (02/25 0100) SpO2:  [99 %-100 %] 99 % (02/25 0100) Weight:  [214 lb 9.6 oz (97.342 kg)] 214 lb 9.6 oz (97.342 kg) (02/25 0500)   Recent Labs Lab 01/31/15 0852  GLUCAP 94    Recent Labs Lab 01/31/15 0744 01/31/15 0749  NA 136 139  K 4.0 4.0  CL 105 103  CO2 25  --   GLUCOSE 100* 104*  BUN 13 16  CREATININE 0.88 0.90  CALCIUM 8.9  --     Recent Labs Lab 01/31/15 0744  AST 22  ALT 21  ALKPHOS 77  BILITOT 0.8  PROT 7.2  ALBUMIN 4.1    Recent Labs Lab 01/31/15 0744 01/31/15 0749  WBC 4.7  --   NEUTROABS 2.2  --   HGB 14.5 16.3  HCT 42.6 48.0   MCV 92.0  --   PLT 150  --     Recent Labs Lab 02/01/15 1650  TROPONINI <0.03    Recent Labs  01/31/15 0744  LABPROT 14.0  INR 1.06    Recent Labs  01/31/15 0958  COLORURINE YELLOW  LABSPEC 1.033*  PHURINE 7.0  GLUCOSEU NEGATIVE  HGBUR NEGATIVE  BILIRUBINUR NEGATIVE  KETONESUR NEGATIVE  PROTEINUR NEGATIVE  UROBILINOGEN 1.0  NITRITE NEGATIVE  LEUKOCYTESUR NEGATIVE       Component Value Date/Time   CHOL 162 02/01/2015 0441   TRIG 153* 02/01/2015 0441   HDL 42 02/01/2015 0441   CHOLHDL 3.9 02/01/2015 0441   VLDL 31 02/01/2015 0441   LDLCALC 89 02/01/2015 0441   Lab Results  Component Value Date   HGBA1C 5.8* 02/01/2015      Component Value Date/Time   LABOPIA NONE DETECTED 01/31/2015 0958   COCAINSCRNUR NONE DETECTED 01/31/2015 0958   LABBENZ NONE DETECTED 01/31/2015 0958   AMPHETMU NONE DETECTED 01/31/2015 0958   THCU NONE DETECTED 01/31/2015 0958   LABBARB NONE DETECTED 01/31/2015 0958     Recent Labs Lab 01/31/15 0742  ETH <5    Ct  Angio Head W/cm &/or Wo Cm 01/31/2015    1. No carotid or anterior circulation hemodynamically significant stenosis or occlusion.  2. Bilateral PCA atherosclerosis, affecting the left P2 and right P3 segments. No major PCA branch occlusion.  3. Stable CT appearance of the brain since 0750 hours with no acute cortically based infarct or acute intracranial hemorrhage identified.  4. Cervical spine CT reported separately.  5. Obstructive pattern left paranasal sinusitis appears to be acute on chronic.    Dg Chest 1 View 01/31/2015    Limited study by poor inspiration. Central vascular congestion without convincing pulmonary edema. No segmental infiltrate.      Dg Thoracic Spine 2 View 01/31/2015    No acute fracture or subluxation. Degenerative changes mid and lower thoracic spine.      Dg Pelvis 1-2 Views 01/31/2015    No fracture or dislocation. 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 Joint spaces appear intact.       Ct Head Wo Contrast 01/31/2015    No definite acute cortical infarction. Mild cerebral atrophy. No intracranial hemorrhage, mass effect or midline shift. Mucosal thickening with complete opacification of the left maxillary sinus. Thickening of bony wall of left maxillary sinus. Mucosal thickening with partial opacification left ethmoid air cells. Mucosal thickening left sphenoid sinus. Complete opacification left frontal sinus.     Ct Angio Neck W/cm &/or Wo/cm 01/31/2015    1. No carotid or anterior circulation hemodynamically significant stenosis or occlusion.  2. Bilateral PCA atherosclerosis, affecting the left P2 and right P3 segments. No major PCA branch occlusion.  3. Stable CT appearance of the brain since 0750 hours with no acute cortically based infarct or acute intracranial hemorrhage identified.  4. Cervical spine CT reported separately.  5. Obstructive pattern left paranasal sinusitis appears to be acute on chronic.    Ct Cervical Spine Wo Contrast  01/31/2015    1. No acute fracture or listhesis identified in the cervical spine. Ligamentous injury is not excluded.  2. CTA Head And Neck reported separately.  3. Lower cervical spine degeneration with mild to moderate degenerative spinal stenosis and moderate to severe degenerative biforaminal stenosis.     Mr Brain Wo Contrast 01/31/2015    1. No acute intracranial abnormality. Largely unremarkable for age non contrast MRI appearance of the brain.  2. Tortuous intracranial arteries with atherosclerosis but no hemodynamically significant intracranial stenosis or major circle of Willis branch occlusion identified.  3. Opacified and obstructed left paranasal sinuses with inspissated secretions/material.      Mr Maxine Glenn Head/brain Wo Cm 01/31/2015    1. No acute intracranial abnormality. Largely unremarkable for age non contrast MRI appearance of the brain.  2. Tortuous intracranial arteries with atherosclerosis but no  hemodynamically significant intracranial stenosis or major circle of Willis branch occlusion identified.  3. Opacified and obstructed left paranasal sinuses with inspissated secretions/material.     PHYSICAL EXAM General: African American male sitting in recliner, sister at bedside Mental Status: Alert, oriented x3, thought content appropriate.  Speech fluent without evidence of aphasia.  Able to follow 3 step commands without difficulty. Cranial Nerves: II: Discs flat bilaterally; Visual fields grossly normal, pupils equal, round, reactive to light and accommodation III,IV, VI: ptosis not present, extra-ocular motions intact bilaterally V,VII: smile symmetric, facial light touch sensation normal bilaterally VIII: hearing normal bilaterally IX,X: gag reflex present XI: bilateral shoulder shrug XII: midline tongue extension without atrophy or fasciculations  Motor: Right : Upper extremity   5/5    Left:  Upper extremity   5/5  Lower extremity   5/5     Lower extremity   5/5 Tone and bulk:normal tone throughout; no atrophy noted Sensory: light touch intact throughout, bilaterally Deep Tendon Reflexes:  Not assessed   Plantars: Right: downgoing   Left: downgoing Cerebellar: normal finger-to-nose,  normal alternating hand movements  Gait: not assessed  CV: pulses palpable throughout       ASSESSMENT/PLAN Mr. Alexander Anthony is a 64 y.o. male with history of right sided numbness, LOC, fall, speech difficulty presenting with likely left hemispheric TIA vs complicated migraine. He did not receive IV t-PA due to improvement of symptoms.   Left hemispheric TIA  Etiology indeterminate-PCA atherosclerosis unlikely to explain all his symptoms  Resultant  Resolution of right sided numbness and speech difficulty now resolved   2/23 CT head No definite acute cortical infarction. Mild cerebral atrophy. No intracranial hemorrhage, mass effect or midline shift. Mucosal thickening with  complete opacification of the left maxillary sinus. Thickening of bony wall of left maxillary sinus. Mucosal thickening with partial opacification left ethmoid air cells. Mucosalthickening left sphenoid sinus. Complete opacification left frontal sinus.  MRI/A 2/23 No acute intracranial abnormality. Largely unremarkable for age non contrast MRI appearance of the brain. Tortuous intracranial arteries with atherosclerosis but no hemodynamically significant intracranial stenosis or major circle of Willis branch occlusion identified.  Opacified and obstructed left paranasal sinuses with inspissated secretions/material.   2/23 CTA head/neck/cervical spine No carotid or anterior circulation hemodynamically significant stenosis or occlusion. Bilateral PCA atherosclerosis, affecting the left P2 and right P3 segments. No major PCA branch occlusion. Stable CT appearance of the brain since 0750 hours with no acute cortically based infarct or acute intracranial hemorrhage identified. Obstructive pattern left paranasal sinusitis appears to be acute on chronic. No acute fracture or listhesis identified in the cervical spine. Ligamentous injury is not excluded. CTA Head And Neck reported separately. Lower cervical spine degeneration with mild to moderate degenerative spinal stenosis and moderate to severe degenerative biforaminal stenosis.   Carotid Doppler  Cancelled   2D Echo  2/24 EF 60-65% grade 1 DD  LDL 89 2/24    HgbA1c 5.8  Heparin for VTE prophylaxis  Diet Heart   aspirin 81 mg orally every day prior to admission, now on aspirin 325 mg orally every day  Patient counseled to be compliant with his antithrombotic medications, smoking cessation   Ongoing aggressive stroke risk factor management  Recommend Aspirin 325 mg qd, add Lipitor 10 or 20 mg, smoking cessation   Primary team-Recommend TEE for tomorrow if negative place loop recorder. NO EEG for now   Neuro will sign off today re consult if  needed   Therapy recommendations:  PT, OT  Disposition:  Per PT, OT, primary team   Hypertension  Home meds:   None, rec add medication for BP control  Unstable  Patient counseled to be compliant with his blood pressure medications  Hyperlipidemia  Home meds:  none resumed in hospital  LDL 89, goal < 70  Add Lipitor 10 or 20 mg qhs  Continue statin at discharge  No history of Diabetes  HgbA1c pending, goal < 7.0  Other Stroke Risk Factors  Advanced age  Cigarette smoker, advised to stop smoking  No Hx stroke/TIA  No Family hx stroke   Intracranial atherosclerosis   FH Migraines  Other Active Problems  Per primary team   Other Pertinent History  Per primary team and see above   [facility]  day # 2  Desma Maxim MD  PGY 3 IM  I have personally examined this patient, reviewed notes, independently viewed imaging studies, participated in medical decision making and plan of care. I have made any additions or clarifications directly to the above note. Agree with note above. He has had a left hemispheric TIA with negative neurovascular evaluation which is likely of embolic etiology. Hence we will like to proceed with TEE with loop recorder implant and will consult cardiology for that. Delia Heady, MD Medical Director [facility] Pager: [phone] 02/02/2015 1:26 PM   02/02/2015, 10:22 AM     To contact Stroke Continuity provider, please refer to [email or web address]. After hours, contact General Neurology

## 2015-02-02 NOTE — Progress Notes (Signed)
Internal Medicine Attending  Date: 02/02/2015  Patient name: Alexander ProseRobert W Anthony Medical record number: [id number] Date of birth: 1951/10/22 Age: 64 y.o. Gender: male  I saw and evaluated the patient, and discussed his care with resident on A.M rounds.  I reviewed the resident's note by Dr. Danella Pentonruong and I agree with the resident's findings and plans as documented in her note.

## 2015-02-02 NOTE — Progress Notes (Signed)
Subjective: Pt states rt strength has improved. States some rt sided cheek numbness.   Objective: Vital signs in last 24 hours: Filed Vitals:   02/01/15 1759 02/02/15 0100 02/02/15 0500 02/02/15 1020  BP: 114/54 117/64  157/82  Pulse: 69 61  58  Temp: 98.6 F (37 C) 98.7 F (37.1 C)  98.7 F (37.1 C)  TempSrc: Oral Oral  Oral  Resp: Height:    (1.905 m)   Weight:   214 lb 9.6 oz (97.342 kg)   SpO2: 99% 99%  99%   Weight change:  No intake or output data in the 24 hours ending 02/02/15 1323 General: NAD, sitting in recliner at bedside Lungs: CTAB, no wheezing Cardiac: RRR, no murmurs GI: soft, active bowel sounds Neuro: 5/5 UE and LE strength b/l, symmetric smile, equal sensation on face b/l  Lab Results: Basic Metabolic Panel:  Recent Labs Lab 01/31/15 0744 01/31/15 0749  NA 136 139  K 4.0 4.0  CL 105 103  CO2 25  --   GLUCOSE 100* 104*  BUN 13 16  CREATININE 0.88 0.90  CALCIUM 8.9  --    Liver Function Tests:  Recent Labs Lab 01/31/15 0744  AST 22  ALT 21  ALKPHOS 77  BILITOT 0.8  PROT 7.2  ALBUMIN 4.1   CBC:  Recent Labs Lab 01/31/15 0744 01/31/15 0749  WBC 4.7  --   NEUTROABS 2.2  --   HGB 14.5 16.3  HCT 42.6 48.0  MCV 92.0  --   PLT 150  --    CBG:  Recent Labs Lab 01/31/15 0852  GLUCAP 94   Fasting Lipid Panel:  Recent Labs Lab 02/01/15 0441  CHOL 162  HDL 42  LDLCALC 89  TRIG 153*  CHOLHDL 3.9   Thyroid Function Tests:  Recent Labs Lab 01/31/15 1230  TSH 1.145   Coagulation:  Recent Labs Lab 01/31/15 0744  LABPROT 14.0  INR 1.06   Studies/Results: No results found. Medications: I have reviewed the patient's current medications. Scheduled Meds: . aspirin  300 mg Rectal Daily   Or  . aspirin  325 mg Oral Daily  . atorvastatin  20 mg Oral q1800  . heparin  5,000 Units Subcutaneous 3 times per day   Continuous Infusions:  PRN Meds:.acetaminophen,  senna-docusate Assessment/Plan: Principal Problem:   TIA (transient ischemic attack) Active Problems:   HTN (hypertension)   Tobacco abuse   Blood per rectum   Stroke   Rt sided weakness--  CTA neck neg for significant carotid stenosis or occlusion, neg for acute infarct. MRI brain neg for acute infarct. Trop x 1 neg - TTE EF 60-65%, neurology recommend TEE and placement of loop recorder if TEE is negative.  - TEE will be done tomorrow, pt NPO after midnight.  -  A1c 5.8 -  LDL 89 w/ goal less than 70, started on lipitor   - asa  daily  HTN: Pt with a h/o HTN and medication non-compliance. Pt BPs fluctuating during admission and he is not completely out of the 48 hour window. - will restart norvasc tomorrow which patient was previously on   Tobacco abuse: Pt smokes 2-4 cigars a day and has since 36.  - Consulting CSW for cessation  Hx of blood per rectum:  - Pt will need outpatient colonoscopy  Likely BPH: Pt with urinary frequency and hesitancy with urination. Increased nocturia. He likely has BPH given his age. UA negative -  can start flomax this admission if symptomatic or recommend on discharge summary  #DVT PPx: [place] Heparin  Dispo: Disposition is deferred at this time, awaiting improvement of current medical problems. Anticipated discharge in approximately 1-2 day(s).   The patient does not have a current PCP (No primary care provider on file.) and [facility].  The patient does not have transportation limitations that hinder transportation to clinic appointments  .Services Needed at time of discharge: Y = Yes, Blank = No PT:   OT:   RN:   Equipment:   Other:     LOS: 2 days   Gara Kroneriana Truong, MD 02/02/2015, 1:23 PM

## 2015-02-03 ENCOUNTER — Encounter (HOSPITAL_COMMUNITY): Payer: Self-pay | Admitting: *Deleted

## 2015-02-03 ENCOUNTER — Encounter (HOSPITAL_COMMUNITY)
Admission: EM | Disposition: A | Discharge status: Home / Self Care | Payer: Self-pay | Source: Home / Self Care | Attending: Internal Medicine

## 2015-02-03 DIAGNOSIS — I10 Essential (primary) hypertension: Secondary | ICD-10-CM

## 2015-02-03 DIAGNOSIS — Z720 Tobacco use: Secondary | ICD-10-CM

## 2015-02-03 DIAGNOSIS — G459 Transient cerebral ischemic attack, unspecified: Principal | ICD-10-CM

## 2015-02-03 DIAGNOSIS — I639 Cerebral infarction, unspecified: Secondary | ICD-10-CM

## 2015-02-03 HISTORY — PX: TEE WITHOUT CARDIOVERSION: SHX5443

## 2015-02-03 SURGERY — ECHOCARDIOGRAM, TRANSESOPHAGEAL
Anesthesia: Moderate Sedation

## 2015-02-03 SURGERY — LOOP RECORDER IMPLANT
Anesthesia: LOCAL

## 2015-02-03 MED ORDER — BUTAMBEN-TETRACAINE-BENZOCAINE 2-2-14 % EX AERO
INHALATION_SPRAY | CUTANEOUS | Status: DC | PRN
  Administered 2015-02-03: 2 via TOPICAL

## 2015-02-03 MED ORDER — MIDAZOLAM HCL 10 MG/2ML IJ SOLN
INTRAMUSCULAR | Status: DC | PRN
  Administered 2015-02-03: 2 mg via INTRAVENOUS
  Administered 2015-02-03: 1 mg via INTRAVENOUS
  Administered 2015-02-03: 2 mg via INTRAVENOUS

## 2015-02-03 MED ORDER — FENTANYL CITRATE 0.05 MG/ML IJ SOLN
INTRAMUSCULAR | Status: AC
  Filled 2015-02-03: qty 2

## 2015-02-03 MED ORDER — LIDOCAINE-EPINEPHRINE 1 %-1:100000 IJ SOLN
INTRAMUSCULAR | Status: AC
  Filled 2015-02-03: qty 1

## 2015-02-03 MED ORDER — FENTANYL CITRATE 0.05 MG/ML IJ SOLN
INTRAMUSCULAR | Status: DC | PRN
  Administered 2015-02-03: 50 ug via INTRAVENOUS
  Administered 2015-02-03: 25 ug via INTRAVENOUS

## 2015-02-03 MED ORDER — ATORVASTATIN CALCIUM 20 MG PO TABS: 20.0000 mg | ORAL_TABLET | Freq: Every day | ORAL | Status: AC

## 2015-02-03 MED ORDER — DIPHENHYDRAMINE HCL 50 MG/ML IJ SOLN
INTRAMUSCULAR | Status: AC
  Filled 2015-02-03: qty 1

## 2015-02-03 MED ORDER — AMLODIPINE BESYLATE 5 MG PO TABS: 5.0000 mg | ORAL_TABLET | Freq: Every day | ORAL | Status: AC

## 2015-02-03 MED ORDER — ASPIRIN 325 MG PO TABS: 325.0000 mg | ORAL_TABLET | Freq: Every day | ORAL | Status: AC

## 2015-02-03 MED ORDER — AMLODIPINE BESYLATE 5 MG PO TABS: 5.0000 mg | ORAL_TABLET | Freq: Every day | ORAL | Status: DC

## 2015-02-03 MED ORDER — SODIUM CHLORIDE 0.9 % IV SOLN: INTRAVENOUS | Status: DC

## 2015-02-03 MED ORDER — MIDAZOLAM HCL 5 MG/ML IJ SOLN
INTRAMUSCULAR | Status: AC
  Filled 2015-02-03: qty 2

## 2015-02-03 NOTE — Progress Notes (Signed)
  Echocardiogram Echocardiogram Transesophageal has been performed.  Arvil ChacoFoster, Terianna Peggs 02/03/2015, 9:33 AM

## 2015-02-03 NOTE — Consult Note (Signed)
ELECTROPHYSIOLOGY CONSULT NOTE  Patient ID: Alexander SOULLIERE MRN: [id number], DOB/AGE: 01/13/1951   Admit date: 01/31/2015 Date of Consult: 02/03/2015  Primary Physician: No primary care provider on file. Primary Cardiologist: new to [facility] Reason for Consultation: Cryptogenic stroke; recommendations regarding Implantable Loop Recorder  History of Present Illness Alexander Anthony was admitted on 01/31/2015 with right sided numbness and speech difficulty. Neurology feels he has had a let hemispheric TIA.  He has undergone workup for stroke including echocardiogram.  The patient has been monitored on telemetry which has demonstrated sinus rhythm with no arrhythmias.  Inpatient stroke work-up is to be completed with a TEE.   Echocardiogram this admission demonstrated EF 60-65%, no RWMA, grade 1 diastolic dysfunction, trivial pericardial effusion, LA 32.  Lab work is reviewed.  Prior to admission, the patient denies chest pain, shortness of breath, dizziness, palpitations, or syncope.  They are recovering from their stroke with plans to return home at discharge.  EP has been asked to evaluate for placement of an implantable loop recorder to monitor for atrial fibrillation.  ROS is negative except as outlined above.    Past Medical History  Diagnosis Date  . Hypertension   . Tobacco abuse   . TIA (transient ischemic attack)      Surgical History:  Past Surgical History  Procedure Laterality Date  . Knee surgery      Right  . Carpal tunnel release       Prescriptions prior to admission  Medication Sig Dispense Refill Last Dose  . aspirin 81 MG tablet Take 81 mg by mouth daily as needed for pain.   Past Week at Unknown time  . loratadine (CLARITIN) 10 MG tablet Take 10 mg by mouth daily as needed for allergies.   01/28/2015  . sodium chloride (OCEAN) 0.65 % SOLN nasal spray Place 1 spray into both nostrils as needed for congestion.   Past Week at Unknown time    Inpatient  Medications:  . aspirin  300 mg Rectal Daily   Or  . aspirin  325 mg Oral Daily  . atorvastatin  20 mg Oral q1800  . heparin  5,000 Units Subcutaneous 3 times per day    Allergies: No Known Allergies  History   Social History  . Marital Status: Married    Spouse Name: N/A  . Number of Children: N/A  . Years of Education: N/A   Occupational History  . Not on file.   Social History Main Topics  . Smoking status: Current Every Day Smoker -- 36 years    Types: Cigars  . Smokeless tobacco: Never Used  . Alcohol Use: No  . Drug Use: No  . Sexual Activity: Not on file   Other Topics Concern  . Not on file   Social History Narrative     Family History  Problem Relation Age of Onset  . Heart attack Father   . Cerebral aneurysm Mother      Physical Exam: Filed Vitals:   02/02/15 1901 02/02/15 2257 02/03/15 0227 02/03/15 0539  BP: 148/78 132/67 159/82 146/76  Pulse: 60 70 57   Temp: 98.1 F (36.7 C) 98.5 F (36.9 C) 98.1 F (36.7 C) 98.4 F (36.9 C)  TempSrc: Oral Oral Oral Oral  Resp: Height:      Weight:      SpO2: 99% 96% 100% 100%    GEN- The patient is well appearing, alert and oriented x 3  today.   Head- normocephalic, atraumatic Eyes-  Sclera clear, conjunctiva pink Ears- hearing intact Oropharynx- clear Neck- supple, Lungs- Clear to ausculation bilaterally, normal work of breathing Heart- Regular rate and rhythm, no murmurs, rubs or gallops, PMI not laterally displaced GI- soft, NT, ND, + BS Extremities- no clubbing, cyanosis, or edema MS- no significant deformity or atrophy Skin- no rash or lesion Psych- euthymic mood, full affect   Labs:   Lab Results  Component Value Date   WBC 4.7 01/31/2015   HGB 16.3 01/31/2015   HCT 48.0 01/31/2015   MCV 92.0 01/31/2015   PLT 150 01/31/2015     Recent Labs Lab 01/31/15 0744 01/31/15 0749  NA 136 139  K 4.0 4.0  CL 105 103  CO2 25  --   BUN 13 16  CREATININE 0.88 0.90    CALCIUM 8.9  --   PROT 7.2  --   BILITOT 0.8  --   ALKPHOS 77  --   ALT 21  --   AST 22  --   GLUCOSE 100* 104*     Radiology/Studies: Ct Angio Head W/cm &/or Wo Cm 01/31/2015   CLINICAL DATA:  64 year old male code stroke. Found down with right side weakness. Initial encounter.  EXAM: CT ANGIOGRAPHY HEAD AND NECK  TECHNIQUE: Multidetector CT imaging of the head and neck was performed using the standard protocol during bolus administration of intravenous contrast. Multiplanar CT image reconstructions and MIPs were obtained to evaluate the vascular anatomy. Carotid stenosis measurements (when applicable) are obtained utilizing NASCET criteria, using the distal internal carotid diameter as the denominator.  CONTRAST:  80mL OMNIPAQUE IOHEXOL 350 MG/ML SOLN  COMPARISON:  Head CT without contrast 0750 hours today.  FINDINGS: CT HEAD  Brain: Stable gray-white matter differentiation. No evidence of cortically based acute infarction identified. No intracranial mass effect.  Calvarium and skull base: Stable and intact.  Paranasal sinuses: Obstructive pattern left paranasal sinusitis with mucoperiosteal thickening. Retained secretions in the left nasal cavity and nasopharynx.  Orbits: Negative.  CTA NECK  Aortic arch: Ectatic arch. Three vessel arch configuration. Tortuous proximal great vessels. No great vessel origin stenosis.  Right carotid system: Negative right CCA aside from mild proximal tortuosity. Soft and calcified plaque at the right carotid bifurcation and right ICA bulb not resulting in stenosis.  Left carotid system: Negative left CCA origin. Proximal left CCA tortuosity. Mild soft and calcified plaque at the left carotid bifurcation and left ICA bulb with no stenosis.  Vertebral arteries:No proximal subclavian arteries stenosis. Both vertebral artery origins appear within normal limits.  Tortuous proximal vertebral arteries more so the left. Fairly codominant vertebrals. Tortuous distal cervical  vertebral arteries, no vertebral artery stenosis in the neck.  Skeleton: No acute osseous abnormality identified. Cervical spine findings are reported separately.  Other neck: Negative lung apices aside from atelectasis. No superior mediastinal lymphadenopathy. Thyroid, larynx, pharynx, parapharyngeal spaces, retropharyngeal space, sublingual space, submandibular glands, and parotid glands are within normal limits. No cervical lymphadenopathy.  CTA HEAD  Posterior circulation: Codominant distal vertebral arteries are patent without stenosis. Normal PICA origins. Tortuous but otherwise negative vertebrobasilar junction. Tortuous basilar artery without stenosis. SCA and PCA origins are within normal limits. P1 segment tortuosity is specially on the left. Posterior communicating arteries are diminutive or absent. Mild to moderate irregularity of the left P2 segment. Mild to moderate irregularity of the right P3 branches. Preserved bilateral distal PCA flow.  Anterior circulation: Patent ICA siphons with no siphon stenosis. Calcified siphon  plaque more so on the left. Ophthalmic artery origins are within normal limits. Tortuous distal siphons and carotid termini. MCA and ACA origins are within normal limits.  Tortuous proximal ACAs. Diminutive or absent anterior communicating artery. Negative ACA branches aside from tortuosity.  Right MCA branches are normal aside from mild irregularity.  Left MCA M1 segment is mildly irregular. Left MCA bifurcation is patent. Left MCA branches are normal aside from mild irregularity.  Venous sinuses: Within normal limits.  Anatomic variants: None.  IMPRESSION: 1. No carotid or anterior circulation hemodynamically significant stenosis or occlusion. 2. Bilateral PCA atherosclerosis, affecting the left P2 and right P3 segments. No major PCA branch occlusion. 3. Stable CT appearance of the brain since 0750 hours with no acute cortically based infarct or acute intracranial hemorrhage  identified. 4. Cervical spine CT reported separately. 5. Obstructive pattern left paranasal sinusitis appears to be acute on chronic. Preliminary report of the vascular findings discussed by telephone with Dr. Thana FarrLESLIE REYNOLDS at [phone] hours on 01/31/2015.   Electronically Signed   By: Odessa FlemingH  Hall M.D.   On: 01/31/2015 09:35   Dg Chest 1 View 01/31/2015   CLINICAL DATA:  Fall, stroke  EXAM: CHEST  1 VIEW  COMPARISON:  None.  FINDINGS: Borderline cardiomegaly. Central vascular congestion without convincing pulmonary edema. Study is limited by poor inspiration. No segmental infiltrate.  IMPRESSION: Limited study by poor inspiration. Central vascular congestion without convincing pulmonary edema. No segmental infiltrate.   Electronically Signed   By: Natasha MeadLiviu  Pop M.D.   On: 01/31/2015 08:51   Mr Brain Wo Contrast 01/31/2015   CLINICAL DATA:  64 year old male found down with right side weakness. Code stroke. Initial encounter.  EXAM: MRI HEAD WITHOUT CONTRAST  MRA HEAD WITHOUT CONTRAST  TECHNIQUE: Multiplanar, multiecho pulse sequences of the brain and surrounding structures were obtained without intravenous contrast. Angiographic images of the head were obtained using MRA technique without contrast.  COMPARISON:  CTA head and neck from today reported separately.  FINDINGS: MRI HEAD FINDINGS  Bulky dural calcifications with mild susceptibility artifact. No restricted diffusion or evidence of acute infarction.  Major intracranial vascular flow voids are preserved. No midline shift, mass effect, evidence of mass lesion, ventriculomegaly, extra-axial collection or acute intracranial hemorrhage. Cervicomedullary junction and pituitary are within normal limits. Negative visualized cervical spine.  Evidence of a chronic micro hemorrhage in the inferior right cerebellum is (series 9, image 12) with susceptibility artifact. Elsewhere minimal nonspecific cerebral white matter T2 and FLAIR hyperintensity the extent of which is within  normal limits for age. No cortical encephalomalacia identified.  Visible internal auditory structures appear normal. Mastoids are clear. Negative orbits soft tissues. Bone marrow signal within normal limits. Visualized scalp soft tissues are within normal limits.  Inspissated secretions opacifying/obstructing the left side paranasal sinuses. The left sphenoid sinus is relatively spared. The right paranasal sinuses are clear.  MRA HEAD FINDINGS  Antegrade flow in the posterior circulation with tortuous vertebrobasilar junction. No distal vertebral artery stenosis and normal PICA origins. No basilar artery stenosis. SCA and PCA origins are within normal limits. Tortuous proximal PCAs more so the left. Bilateral P2 and to a lesser extent P 3 PCA irregularity compatible with atherosclerosis. Bilateral preserved distal flow with no high-grade PCA stenosis identified. Posterior communicating arteries are diminutive or absent.  Antegrade flow in both ICA siphons. Tortuous siphons and carotid termini with irregularity but no stenosis. Ophthalmic artery origins are within normal limits. MCA and ACA origins are patent. Proximal ACA is  are tortuous. Anterior communicating artery is diminutive or absent. Negative visualized bilateral ACA branches.  Both MCA M1 segments and MCA bifurcations are patent. M1 segment tortuosity and irregularity bilaterally. No major MCA branch occlusion or focal stenosis identified.  IMPRESSION: 1. No acute intracranial abnormality. Largely unremarkable for age non contrast MRI appearance of the brain. 2. Tortuous intracranial arteries with atherosclerosis but no hemodynamically significant intracranial stenosis or major circle of Willis branch occlusion identified. 3. Opacified and obstructed left paranasal sinuses with inspissated secretions/material.   Electronically Signed   By: Odessa Fleming M.D.   On: 01/31/2015 11:54    12-lead ECG sinus rhythm, rate 66, 1st degree AV block, PR 245  Telemetry  sinus rhythm with PVC's  Assessment and Plan:  1. Cryptogenic stroke The patient presents with cryptogenic stroke.  The patient has a TEE planned for this AM.  I spoke at length with the patient about monitoring for afib with either a 30 day event monitor or an implantable loop recorder.  Risks, benefits, and alteratives to implantable loop recorder were discussed with the patient today.   At this time, the patient is very clear in their decision to proceed with implantable loop recorder.   Wound care was reviewed with the patient (keep incision clean and dry for 3 days).  Wound check scheduled for 02-13-15 at 3:30PM.   2. HTN Stable No change required today  3. Tobacco Smoking cessation is advised  Please call with questions.   Hillis Range MD 02/03/2015 7:59 AM

## 2015-02-03 NOTE — CV Procedure (Signed)
SURGEON:  Hillis RangeJames Katriel Cutsforth, MD     PREPROCEDURE DIAGNOSIS:  Cryptogenic Stroke    POSTPROCEDURE DIAGNOSIS:  Cryptogenic Stroke     PROCEDURES:   1. Implantable loop recorder implantation    INTRODUCTION:  Alexander ProseRobert W Dicocco is a 64 y.o. male with a history of unexplained stroke who presents today for implantable loop implantation.  The patient has had a cryptogenic stroke.  Despite an extensive workup by neurology, no reversible causes have been identified.  he has worn telemetry during which he did not have arrhythmias.  There is significant concern for possible atrial fibrillation as the cause for the patients stroke.  The patient therefore presents today for implantable loop implantation.     DESCRIPTION OF PROCEDURE:  Informed written consent was obtained, and the patient was brought to the electrophysiology lab in a fasting state.  The patient required no sedation for the procedure today.  Mapping over the patient's chest was performed by the EP lab staff to identify the area where electrograms were most prominent for ILR recording.  This area was found to be the left parasternal region over the 3rd-4th intercostal space. The patients left chest was therefore prepped and draped in the usual sterile fashion by the EP lab staff. The skin overlying the left parasternal region was infiltrated with lidocaine for local analgesia.  A 0.5-cm incision was made over the left parasternal region over the 3rd intercostal space.  A subcutaneous ILR pocket was fashioned using a combination of sharp and blunt dissection.  A Med[employer]onic Reveal BrentonLinq model [id number] S implantable loop recorder was then placed into the pocket  R waves were very prominent and measured 0.7139mV. EBL<1 ml.  Steri- Strips and a sterile dressing were then applied.  There were no early apparent complications.     CONCLUSIONS:   1. Successful implantation of a Med[employer]onic Reveal LINQ implantable loop recorder for cryptogenic stroke  2. No early  apparent complications.

## 2015-02-03 NOTE — H&P (View-Only) (Signed)
  ELECTROPHYSIOLOGY CONSULT NOTE  Patient ID: Alexander Anthony MRN: [id number], DOB/AGE: 03/12/1951   Admit date: 01/31/2015 Date of Consult: 02/03/2015  Primary Physician: No primary care provider on file. Primary Cardiologist: new to [facility] Reason for Consultation: Cryptogenic stroke; recommendations regarding Implantable Loop Recorder  History of Present Illness Alexander Anthony was admitted on 01/31/2015 with right sided numbness and speech difficulty. Neurology feels he has had a let hemispheric TIA.  He has undergone workup for stroke including echocardiogram.  The patient has been monitored on telemetry which has demonstrated sinus rhythm with no arrhythmias.  Inpatient stroke work-up is to be completed with a TEE.   Echocardiogram this admission demonstrated EF 60-65%, no RWMA, grade 1 diastolic dysfunction, trivial pericardial effusion, LA 32.  Lab work is reviewed.  Prior to admission, the patient denies chest pain, shortness of breath, dizziness, palpitations, or syncope.  They are recovering from their stroke with plans to return home at discharge.  EP has been asked to evaluate for placement of an implantable loop recorder to monitor for atrial fibrillation.  ROS is negative except as outlined above.    Past Medical History  Diagnosis Date  . Hypertension   . Tobacco abuse   . TIA (transient ischemic attack)      Surgical History:  Past Surgical History  Procedure Laterality Date  . Knee surgery      Right  . Carpal tunnel release       Prescriptions prior to admission  Medication Sig Dispense Refill Last Dose  . aspirin 81 MG tablet Take 81 mg by mouth daily as needed for pain.   Past Week at Unknown time  . loratadine (CLARITIN) 10 MG tablet Take 10 mg by mouth daily as needed for allergies.   01/28/2015  . sodium chloride (OCEAN) 0.65 % SOLN nasal spray Place 1 spray into both nostrils as needed for congestion.   Past Week at Unknown time    Inpatient  Medications:  . aspirin  300 mg Rectal Daily   Or  . aspirin  325 mg Oral Daily  . atorvastatin  20 mg Oral q1800  . heparin  5,000 Units Subcutaneous 3 times per day    Allergies: No Known Allergies  History   Social History  . Marital Status: Married    Spouse Name: N/A  . Number of Children: N/A  . Years of Education: N/A   Occupational History  . Not on file.   Social History Main Topics  . Smoking status: Current Every Day Smoker -- 36 years    Types: Cigars  . Smokeless tobacco: Never Used  . Alcohol Use: No  . Drug Use: No  . Sexual Activity: Not on file   Other Topics Concern  . Not on file   Social History Narrative     Family History  Problem Relation Age of Onset  . Heart attack Father   . Cerebral aneurysm Mother      Physical Exam: Filed Vitals:   02/02/15 1901 02/02/15 2257 02/03/15 0227 02/03/15 0539  BP: 148/78 132/67 159/82 146/76  Pulse: 60 70 57   Temp: 98.1 F (36.7 C) 98.5 F (36.9 C) 98.1 F (36.7 C) 98.4 F (36.9 C)  TempSrc: Oral Oral Oral Oral  Resp: 18 18 16 16  Height:      Weight:      SpO2: 99% 96% 100% 100%    GEN- The patient is well appearing, alert and oriented x 3   today.   Head- normocephalic, atraumatic Eyes-  Sclera clear, conjunctiva pink Ears- hearing intact Oropharynx- clear Neck- supple, Lungs- Clear to ausculation bilaterally, normal work of breathing Heart- Regular rate and rhythm, no murmurs, rubs or gallops, PMI not laterally displaced GI- soft, NT, ND, + BS Extremities- no clubbing, cyanosis, or edema MS- no significant deformity or atrophy Skin- no rash or lesion Psych- euthymic mood, full affect   Labs:   Lab Results  Component Value Date   WBC 4.7 01/31/2015   HGB 16.3 01/31/2015   HCT 48.0 01/31/2015   MCV 92.0 01/31/2015   PLT 150 01/31/2015     Recent Labs Lab 01/31/15 0744 01/31/15 0749  NA 136 139  K 4.0 4.0  CL 105 103  CO2 25  --   BUN 13 16  CREATININE 0.88 0.90    CALCIUM 8.9  --   PROT 7.2  --   BILITOT 0.8  --   ALKPHOS 77  --   ALT 21  --   AST 22  --   GLUCOSE 100* 104*     Radiology/Studies: Ct Angio Head W/cm &/or Wo Cm 01/31/2015   CLINICAL DATA:  64-year-old male code stroke. Found down with right side weakness. Initial encounter.  EXAM: CT ANGIOGRAPHY HEAD AND NECK  TECHNIQUE: Multidetector CT imaging of the head and neck was performed using the standard protocol during bolus administration of intravenous contrast. Multiplanar CT image reconstructions and MIPs were obtained to evaluate the vascular anatomy. Carotid stenosis measurements (when applicable) are obtained utilizing NASCET criteria, using the distal internal carotid diameter as the denominator.  CONTRAST:  80mL OMNIPAQUE IOHEXOL 350 MG/ML SOLN  COMPARISON:  Head CT without contrast 0750 hours today.  FINDINGS: CT HEAD  Brain: Stable gray-white matter differentiation. No evidence of cortically based acute infarction identified. No intracranial mass effect.  Calvarium and skull base: Stable and intact.  Paranasal sinuses: Obstructive pattern left paranasal sinusitis with mucoperiosteal thickening. Retained secretions in the left nasal cavity and nasopharynx.  Orbits: Negative.  CTA NECK  Aortic arch: Ectatic arch. Three vessel arch configuration. Tortuous proximal great vessels. No great vessel origin stenosis.  Right carotid system: Negative right CCA aside from mild proximal tortuosity. Soft and calcified plaque at the right carotid bifurcation and right ICA bulb not resulting in stenosis.  Left carotid system: Negative left CCA origin. Proximal left CCA tortuosity. Mild soft and calcified plaque at the left carotid bifurcation and left ICA bulb with no stenosis.  Vertebral arteries:No proximal subclavian arteries stenosis. Both vertebral artery origins appear within normal limits.  Tortuous proximal vertebral arteries more so the left. Fairly codominant vertebrals. Tortuous distal cervical  vertebral arteries, no vertebral artery stenosis in the neck.  Skeleton: No acute osseous abnormality identified. Cervical spine findings are reported separately.  Other neck: Negative lung apices aside from atelectasis. No superior mediastinal lymphadenopathy. Thyroid, larynx, pharynx, parapharyngeal spaces, retropharyngeal space, sublingual space, submandibular glands, and parotid glands are within normal limits. No cervical lymphadenopathy.  CTA HEAD  Posterior circulation: Codominant distal vertebral arteries are patent without stenosis. Normal PICA origins. Tortuous but otherwise negative vertebrobasilar junction. Tortuous basilar artery without stenosis. SCA and PCA origins are within normal limits. P1 segment tortuosity is specially on the left. Posterior communicating arteries are diminutive or absent. Mild to moderate irregularity of the left P2 segment. Mild to moderate irregularity of the right P3 branches. Preserved bilateral distal PCA flow.  Anterior circulation: Patent ICA siphons with no siphon stenosis. Calcified siphon   plaque more so on the left. Ophthalmic artery origins are within normal limits. Tortuous distal siphons and carotid termini. MCA and ACA origins are within normal limits.  Tortuous proximal ACAs. Diminutive or absent anterior communicating artery. Negative ACA branches aside from tortuosity.  Right MCA branches are normal aside from mild irregularity.  Left MCA M1 segment is mildly irregular. Left MCA bifurcation is patent. Left MCA branches are normal aside from mild irregularity.  Venous sinuses: Within normal limits.  Anatomic variants: None.  IMPRESSION: 1. No carotid or anterior circulation hemodynamically significant stenosis or occlusion. 2. Bilateral PCA atherosclerosis, affecting the left P2 and right P3 segments. No major PCA branch occlusion. 3. Stable CT appearance of the brain since 0750 hours with no acute cortically based infarct or acute intracranial hemorrhage  identified. 4. Cervical spine CT reported separately. 5. Obstructive pattern left paranasal sinusitis appears to be acute on chronic. Preliminary report of the vascular findings discussed by telephone with Dr. LESLIE REYNOLDS at 0839 hours on 01/31/2015.   Electronically Signed   By: H  Hall M.D.   On: 01/31/2015 09:35   Dg Chest 1 View 01/31/2015   CLINICAL DATA:  Fall, stroke  EXAM: CHEST  1 VIEW  COMPARISON:  None.  FINDINGS: Borderline cardiomegaly. Central vascular congestion without convincing pulmonary edema. Study is limited by poor inspiration. No segmental infiltrate.  IMPRESSION: Limited study by poor inspiration. Central vascular congestion without convincing pulmonary edema. No segmental infiltrate.   Electronically Signed   By: Liviu  Pop M.D.   On: 01/31/2015 08:51   Mr Brain Wo Contrast 01/31/2015   CLINICAL DATA:  64-year-old male found down with right side weakness. Code stroke. Initial encounter.  EXAM: MRI HEAD WITHOUT CONTRAST  MRA HEAD WITHOUT CONTRAST  TECHNIQUE: Multiplanar, multiecho pulse sequences of the brain and surrounding structures were obtained without intravenous contrast. Angiographic images of the head were obtained using MRA technique without contrast.  COMPARISON:  CTA head and neck from today reported separately.  FINDINGS: MRI HEAD FINDINGS  Bulky dural calcifications with mild susceptibility artifact. No restricted diffusion or evidence of acute infarction.  Major intracranial vascular flow voids are preserved. No midline shift, mass effect, evidence of mass lesion, ventriculomegaly, extra-axial collection or acute intracranial hemorrhage. Cervicomedullary junction and pituitary are within normal limits. Negative visualized cervical spine.  Evidence of a chronic micro hemorrhage in the inferior right cerebellum is (series 9, image 12) with susceptibility artifact. Elsewhere minimal nonspecific cerebral white matter T2 and FLAIR hyperintensity the extent of which is within  normal limits for age. No cortical encephalomalacia identified.  Visible internal auditory structures appear normal. Mastoids are clear. Negative orbits soft tissues. Bone marrow signal within normal limits. Visualized scalp soft tissues are within normal limits.  Inspissated secretions opacifying/obstructing the left side paranasal sinuses. The left sphenoid sinus is relatively spared. The right paranasal sinuses are clear.  MRA HEAD FINDINGS  Antegrade flow in the posterior circulation with tortuous vertebrobasilar junction. No distal vertebral artery stenosis and normal PICA origins. No basilar artery stenosis. SCA and PCA origins are within normal limits. Tortuous proximal PCAs more so the left. Bilateral P2 and to a lesser extent P 3 PCA irregularity compatible with atherosclerosis. Bilateral preserved distal flow with no high-grade PCA stenosis identified. Posterior communicating arteries are diminutive or absent.  Antegrade flow in both ICA siphons. Tortuous siphons and carotid termini with irregularity but no stenosis. Ophthalmic artery origins are within normal limits. MCA and ACA origins are patent. Proximal ACA is   are tortuous. Anterior communicating artery is diminutive or absent. Negative visualized bilateral ACA branches.  Both MCA M1 segments and MCA bifurcations are patent. M1 segment tortuosity and irregularity bilaterally. No major MCA branch occlusion or focal stenosis identified.  IMPRESSION: 1. No acute intracranial abnormality. Largely unremarkable for age non contrast MRI appearance of the brain. 2. Tortuous intracranial arteries with atherosclerosis but no hemodynamically significant intracranial stenosis or major circle of Willis branch occlusion identified. 3. Opacified and obstructed left paranasal sinuses with inspissated secretions/material.   Electronically Signed   By: H  Hall M.D.   On: 01/31/2015 11:54    12-lead ECG sinus rhythm, rate 66, 1st degree AV block, PR 245  Telemetry  sinus rhythm with PVC's  Assessment and Plan:  1. Cryptogenic stroke The patient presents with cryptogenic stroke.  The patient has a TEE planned for this AM.  I spoke at length with the patient about monitoring for afib with either a 30 day event monitor or an implantable loop recorder.  Risks, benefits, and alteratives to implantable loop recorder were discussed with the patient today.   At this time, the patient is very clear in their decision to proceed with implantable loop recorder.   Wound care was reviewed with the patient (keep incision clean and dry for 3 days).  Wound check scheduled for 02-13-15 at 3:30PM.   2. HTN Stable No change required today  3. Tobacco Smoking cessation is advised  Please call with questions.   Aidyn Kellis MD 02/03/2015 7:59 AM      

## 2015-02-03 NOTE — Progress Notes (Signed)
UR COMPLETED  

## 2015-02-03 NOTE — Discharge Instructions (Signed)
Transient Ischemic Attack  A transient ischemic attack (TIA) is a "warning stroke" that causes stroke-like symptoms. Unlike a stroke, a TIA does not cause permanent damage to the brain. The symptoms of a TIA can happen very fast and do not last long. It is important to know the symptoms of a TIA and what to do. This can help prevent a major stroke or death.  CAUSES   · A TIA is caused by a temporary blockage in an artery in the brain or neck (carotid artery). The blockage does not allow the brain to get the blood supply it needs and can cause different symptoms. The blockage can be caused by either:  ¨ A blood clot.  ¨ Fatty buildup (plaque) in a neck or brain artery.  RISK FACTORS  · High blood pressure (hypertension).  · High cholesterol.  · Diabetes mellitus.  · Heart disease.  · The build up of plaque in the blood vessels (peripheral artery disease or atherosclerosis).  · The build up of plaque in the blood vessels providing blood and oxygen to the brain (carotid artery stenosis).  · An abnormal heart rhythm (atrial fibrillation).  · Obesity.  · Smoking.  · Taking oral contraceptives (especially in combination with smoking).  · Physical inactivity.  · A diet high in fats, salt (sodium), and calories.  · Alcohol use.  · Use of illegal drugs (especially cocaine and methamphetamine).  · Being male.  · Being African American.  · Being over the age of 55.  · Family history of stroke.  · Previous history of blood clots, stroke, TIA, or heart attack.  · Sickle cell disease.  SYMPTOMS   TIA symptoms are the same as a stroke but are temporary. These symptoms usually develop suddenly, or may be newly present upon awakening from sleep:  · Sudden weakness or numbness of the face, arm, or leg, especially on one side of the body.  · Sudden trouble walking or difficulty moving arms or legs.  · Sudden confusion.  · Sudden personality changes.  · Trouble speaking (aphasia) or understanding.  · Difficulty swallowing.  · Sudden  trouble seeing in one or both eyes.  · Double vision.  · Dizziness.  · Loss of balance or coordination.  · Sudden severe headache with no known cause.  · Trouble reading or writing.  · Loss of bowel or bladder control.  · Loss of consciousness.  DIAGNOSIS   Your caregiver may be able to determine the presence or absence of a TIA based on your symptoms, history, and physical exam. Computed tomography (CT scan) of the brain is usually performed to help identify a TIA. Other tests may be done to diagnose a TIA. These tests may include:  · Electrocardiography.  · Continuous heart monitoring.  · Echocardiography.  · Carotid ultrasonography.  · Magnetic resonance imaging (MRI).  · A scan of the brain circulation.  · Blood tests.  PREVENTION   The risk of a TIA can be decreased by appropriately treating high blood pressure, high cholesterol, diabetes, heart disease, and obesity and by quitting smoking, limiting alcohol, and staying physically active.  TREATMENT   Time is of the essence. Since the symptoms of TIA are the same as a stroke, it is important to seek treatment as soon as possible because you may need a medicine to dissolve the clot (thrombolytic) that cannot be given if too much time has passed. Treatment options vary. Treatment options may include rest, oxygen, intravenous (IV) fluids,   and medicines to thin the blood (anticoagulants). Medicines and diet may be used to address diabetes, high blood pressure, and other risk factors. Measures will be taken to prevent short-term and long-term complications, including infection from breathing foreign material into the lungs (aspiration pneumonia), blood clots in the legs, and falls. Treatment options include procedures to either remove plaque in the carotid arteries or dilate carotid arteries that have narrowed due to plaque. Those procedures are:  · Carotid endarterectomy.  · Carotid angioplasty and stenting.  HOME CARE INSTRUCTIONS   · Take all medicines prescribed  by your caregiver. Follow the directions carefully. Medicines may be used to control risk factors for a stroke. Be sure you understand all your medicine instructions.  · You may be told to take aspirin or the anticoagulant warfarin. Warfarin needs to be taken exactly as instructed.  ¨ Taking too much or too little warfarin is dangerous. Too much warfarin increases the risk of bleeding. Too little warfarin continues to allow the risk for blood clots. While taking warfarin, you will need to have regular blood tests to measure your blood clotting time. A PT blood test measures how long it takes for blood to clot. Your PT is used to calculate another value called an INR. Your PT and INR help your caregiver to adjust your dose of warfarin. The dose can change for many reasons. It is critically important that you take warfarin exactly as prescribed.  ¨ Many foods, especially foods high in vitamin K can interfere with warfarin and affect the PT and INR. Foods high in vitamin K include spinach, kale, broccoli, cabbage, collard and turnip greens, brussels sprouts, peas, cauliflower, seaweed, and parsley as well as beef and pork liver, green tea, and soybean oil. You should eat a consistent amount of foods high in vitamin K. Avoid major changes in your diet, or notify your caregiver before changing your diet. Arrange a visit with a dietitian to answer your questions.  ¨ Many medicines can interfere with warfarin and affect the PT and INR. You must tell your caregiver about any and all medicines you take, this includes all vitamins and supplements. Be especially cautious with aspirin and anti-inflammatory medicines. Do not take or discontinue any prescribed or over-the-counter medicine except on the advice of your caregiver or pharmacist.  ¨ Warfarin can have side effects, such as excessive bruising or bleeding. You will need to hold pressure over cuts for longer than usual. Your caregiver or pharmacist will discuss other  potential side effects.  ¨ Avoid sports or activities that may cause injury or bleeding.  ¨ Be mindful when shaving, flossing your teeth, or handling sharp objects.  ¨ Alcohol can change the body's ability to handle warfarin. It is best to avoid alcoholic drinks or consume only very small amounts while taking warfarin. Notify your caregiver if you change your alcohol intake.  ¨ Notify your dentist or other caregivers before procedures.  · Eat a diet that includes 5 or more servings of fruits and vegetables each day. This may reduce the risk of stroke. Certain diets may be prescribed to address high blood pressure, high cholesterol, diabetes, or obesity.  ¨ A low-sodium, low-saturated fat, low-trans fat, low-cholesterol diet is recommended to manage high blood pressure.  ¨ A low-saturated fat, low-trans fat, low-cholesterol, and high-fiber diet may control cholesterol levels.  ¨ A controlled-carbohydrate, controlled-sugar diet is recommended to manage diabetes.  ¨ A reduced-calorie, low-sodium, low-saturated fat, low-trans fat, low-cholesterol diet is recommended to manage obesity.  ·   Maintain a healthy weight.  · Stay physically active. It is recommended that you get at least 30 minutes of activity on most or all days.  · Do not smoke.  · Limit alcohol use even if you are not taking warfarin. Moderate alcohol use is considered to be:  ¨ No more than 2 drinks each day for men.  ¨ No more than 1 drink each day for nonpregnant women.  · Stop drug abuse.  · Home safety. A safe home environment is important to reduce the risk of falls. Your caregiver may arrange for specialists to evaluate your home. Having grab bars in the bedroom and bathroom is often important. Your caregiver may arrange for equipment to be used at home, such as raised toilets and a seat for the shower.  · Follow all instructions for follow-up with your caregiver. This is very important. This includes any referrals and lab tests. Proper follow up can  prevent a stroke or another TIA from occurring.  SEEK MEDICAL CARE IF:  · You have personality changes.  · You have difficulty swallowing.  · You are seeing double.  · You have dizziness.  · You have a fever.  · You have skin breakdown.  SEEK IMMEDIATE MEDICAL CARE IF:   Any of these symptoms may represent a serious problem that is an emergency. Do not wait to see if the symptoms will go away. Get medical help right away. Call your local emergency services (911 in [place] Do not drive yourself to the hospital.  · You have sudden weakness or numbness of the face, arm, or leg, especially on one side of the body.  · You have sudden trouble walking or difficulty moving arms or legs.  · You have sudden confusion.  · You have trouble speaking (aphasia) or understanding.  · You have sudden trouble seeing in one or both eyes.  · You have a loss of balance or coordination.  · You have a sudden, severe headache with no known cause.  · You have new chest pain or an irregular heartbeat.  · You have a partial or total loss of consciousness.  MAKE SURE YOU:   · Understand these instructions.  · Will watch your condition.  · Will get help right away if you are not doing well or get worse.  Document Released: 09/04/2005 Document Revised: 11/30/2013 Document Reviewed: 03/02/2014  [employer]® Patient Information ©2015 [employer]. This information is not intended to replace advice given to you by your health care provider. Make sure you discuss any questions you have with your health care provider.

## 2015-02-03 NOTE — Discharge Summary (Signed)
Name: Alexander Anthony MRN: [id number] DOB: 1951/05/12 64 y.o. PCP: No primary care provider on file.  Date of Admission: 01/31/2015  7:44 AM Date of Discharge: 02/03/2015 Attending Physician: Farley Ly, MD  Discharge Diagnosis:   TIA (transient ischemic attack)   HTN (hypertension)   Tobacco abuse   Blood per rectum  Discharge Medications:   Medication List    STOP taking these medications        sodium chloride 0.65 % Soln nasal spray  Commonly known as:  OCEAN      TAKE these medications        amLODipine 5 MG tablet  Commonly known as:  NORVASC  Take 1 tablet (5 mg total) by mouth daily.     aspirin 325 MG tablet  Take 1 tablet (325 mg total) by mouth daily.     atorvastatin 20 MG tablet  Commonly known as:  LIPITOR  Take 1 tablet (20 mg total) by mouth daily at 6 PM.     loratadine 10 MG tablet  Commonly known as:  CLARITIN  Take 10 mg by mouth daily as needed for allergies.        Disposition and follow-up:   [name] was discharged from [facility] in Stable condition.  At the hospital follow up visit please address:  1.  Pt sent home on asa 325mg  daily, lipitor 20mg  qd, and norvasc 5mg . Please ensure compliance w/ medications.   2.  Labs / imaging needed at time of follow-up: none  3.  Pending labs/ test needing follow-up: Pt will need outpatient colonoscopy scheduled for screening and hx BRBPR.  Follow-up Appointments:     Follow-up Information    Follow up with SETHI,PRAMOD, MD In 2 months.   Specialties:  Neurology, Radiology   Contact information:   [address] [place] [address] [phone]       Follow up with [facility] On 02/13/2015.   Why:  at 3:30pm   Contact information:   [address] [place] [address] [phone]       Consultations: Treatment Team:  Rounding Lbcardiology, MD  Procedures  Performed:  Ct Angio Head W/cm &/or Wo Cm  01/31/2015   CLINICAL DATA:  64 year old male code stroke. Found down with right side weakness. Initial encounter.  EXAM: CT ANGIOGRAPHY HEAD AND NECK  TECHNIQUE: Multidetector CT imaging of the head and neck was performed using the standard protocol during bolus administration of intravenous contrast. Multiplanar CT image reconstructions and MIPs were obtained to evaluate the vascular anatomy. Carotid stenosis measurements (when applicable) are obtained utilizing NASCET criteria, using the distal internal carotid diameter as the denominator.  CONTRAST:  80mL OMNIPAQUE IOHEXOL 350 MG/ML SOLN  COMPARISON:  Head CT without contrast 0750 hours today.  FINDINGS: CT HEAD  Brain: Stable gray-white matter differentiation. No evidence of cortically based acute infarction identified. No intracranial mass effect.  Calvarium and skull base: Stable and intact.  Paranasal sinuses: Obstructive pattern left paranasal sinusitis with mucoperiosteal thickening. Retained secretions in the left nasal cavity and nasopharynx.  Orbits: Negative.  CTA NECK  Aortic arch: Ectatic arch. Three vessel arch configuration. Tortuous proximal great vessels. No great vessel origin stenosis.  Right carotid system: Negative right CCA aside from mild proximal tortuosity. Soft and calcified plaque at the right carotid bifurcation and right ICA bulb not resulting in stenosis.  Left carotid system: Negative left CCA origin. Proximal  left CCA tortuosity. Mild soft and calcified plaque at the left carotid bifurcation and left ICA bulb with no stenosis.  Vertebral arteries:No proximal subclavian arteries stenosis. Both vertebral artery origins appear within normal limits.  Tortuous proximal vertebral arteries more so the left. Fairly codominant vertebrals. Tortuous distal cervical vertebral arteries, no vertebral artery stenosis in the neck.  Skeleton: No acute osseous abnormality identified. Cervical spine  findings are reported separately.  Other neck: Negative lung apices aside from atelectasis. No superior mediastinal lymphadenopathy. Thyroid, larynx, pharynx, parapharyngeal spaces, retropharyngeal space, sublingual space, submandibular glands, and parotid glands are within normal limits. No cervical lymphadenopathy.  CTA HEAD  Posterior circulation: Codominant distal vertebral arteries are patent without stenosis. Normal PICA origins. Tortuous but otherwise negative vertebrobasilar junction. Tortuous basilar artery without stenosis. SCA and PCA origins are within normal limits. P1 segment tortuosity is specially on the left. Posterior communicating arteries are diminutive or absent. Mild to moderate irregularity of the left P2 segment. Mild to moderate irregularity of the right P3 branches. Preserved bilateral distal PCA flow.  Anterior circulation: Patent ICA siphons with no siphon stenosis. Calcified siphon plaque more so on the left. Ophthalmic artery origins are within normal limits. Tortuous distal siphons and carotid termini. MCA and ACA origins are within normal limits.  Tortuous proximal ACAs. Diminutive or absent anterior communicating artery. Negative ACA branches aside from tortuosity.  Right MCA branches are normal aside from mild irregularity.  Left MCA M1 segment is mildly irregular. Left MCA bifurcation is patent. Left MCA branches are normal aside from mild irregularity.  Venous sinuses: Within normal limits.  Anatomic variants: None.  IMPRESSION: 1. No carotid or anterior circulation hemodynamically significant stenosis or occlusion. 2. Bilateral PCA atherosclerosis, affecting the left P2 and right P3 segments. No major PCA branch occlusion. 3. Stable CT appearance of the brain since 0750 hours with no acute cortically based infarct or acute intracranial hemorrhage identified. 4. Cervical spine CT reported separately. 5. Obstructive pattern left paranasal sinusitis appears to be acute on chronic.  Preliminary report of the vascular findings discussed by telephone with Dr. Thana FarrLESLIE REYNOLDS at [phone] hours on 01/31/2015.   Electronically Signed   By: Odessa FlemingH  Hall M.D.   On: 01/31/2015 09:35   Dg Chest 1 View  01/31/2015   CLINICAL DATA:  Fall, stroke  EXAM: CHEST  1 VIEW  COMPARISON:  None.  FINDINGS: Borderline cardiomegaly. Central vascular congestion without convincing pulmonary edema. Study is limited by poor inspiration. No segmental infiltrate.  IMPRESSION: Limited study by poor inspiration. Central vascular congestion without convincing pulmonary edema. No segmental infiltrate.   Electronically Signed   By: Natasha MeadLiviu  Pop M.D.   On: 01/31/2015 08:51   Dg Thoracic Spine 2 View  01/31/2015   CLINICAL DATA:  Fall, upper back pain  EXAM: THORACIC SPINE - 2 VIEW  COMPARISON:  None.  FINDINGS: Three views of thoracic spine submitted. No acute fracture or subluxation degenerative changes are noted mid and lower thoracic spine. Alignment and vertebral body heights are preserved.  IMPRESSION: No acute fracture or subluxation. Degenerative changes mid and lower thoracic spine.   Electronically Signed   By: Natasha MeadLiviu  Pop M.D.   On: 01/31/2015 08:52   Dg Pelvis 1-2 Views  01/31/2015   CLINICAL DATA:  Pain following trauma/fall  EXAM: PELVIS - 1-2 VIEW  COMPARISON:  None.  FINDINGS: Contrast is seen in the distal ureters and urinary bladder. There is no demonstrable fracture or dislocation. Joint spaces appear intact. No erosive change.  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  IMPRESSION: No fracture or dislocation. 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 Joint spaces appear intact.   Electronically Signed   By: Bretta Bang III M.D.   On: 01/31/2015 08:51   Ct Head Wo Contrast  01/31/2015   CLINICAL DATA:  Code stroke, found on the floor, right side weakness  EXAM: CT HEAD WITHOUT CONTRAST  TECHNIQUE: Contiguous axial images were obtained from the base of the skull through the vertex without intravenous contrast.   COMPARISON:  None.  FINDINGS: No skull fracture is noted. There is mucosal thickening with complete opacification of the left maxillary sinus. There is thickening of bony wall of the left maxillary sinus. Mucosal thickening with partial opacification left ethmoid air cells. Mucosal thickening left sphenoid sinus. Mucosal thickening with complete opacification left frontal sinus.  No intracranial hemorrhage, mass effect or midline shift. No definite acute cortical infarction. No mass lesion is noted on this unenhanced scan. Mild cerebral atrophy.  IMPRESSION: No definite acute cortical infarction. Mild cerebral atrophy. No intracranial hemorrhage, mass effect or midline shift. Mucosal thickening with complete opacification of the left maxillary sinus. Thickening of bony wall of left maxillary sinus. Mucosal thickening with partial opacification left ethmoid air cells. Mucosal thickening left sphenoid sinus. Complete opacification left frontal sinus.  These results were called by telephone at the time of interpretation on 01/31/2015 at 8:16 am to Dr. Thad Ranger, who verbally acknowledged these results.   Electronically Signed   By: Natasha Mead M.D.   On: 01/31/2015 08:17   Ct Angio Neck W/cm &/or Wo/cm  01/31/2015   CLINICAL DATA:  64 year old male code stroke. Found down with right side weakness. Initial encounter.  EXAM: CT ANGIOGRAPHY HEAD AND NECK  TECHNIQUE: Multidetector CT imaging of the head and neck was performed using the standard protocol during bolus administration of intravenous contrast. Multiplanar CT image reconstructions and MIPs were obtained to evaluate the vascular anatomy. Carotid stenosis measurements (when applicable) are obtained utilizing NASCET criteria, using the distal internal carotid diameter as the denominator.  CONTRAST:  80mL OMNIPAQUE IOHEXOL 350 MG/ML SOLN  COMPARISON:  Head CT without contrast 0750 hours today.  FINDINGS: CT HEAD  Brain: Stable gray-white matter differentiation. No  evidence of cortically based acute infarction identified. No intracranial mass effect.  Calvarium and skull base: Stable and intact.  Paranasal sinuses: Obstructive pattern left paranasal sinusitis with mucoperiosteal thickening. Retained secretions in the left nasal cavity and nasopharynx.  Orbits: Negative.  CTA NECK  Aortic arch: Ectatic arch. Three vessel arch configuration. Tortuous proximal great vessels. No great vessel origin stenosis.  Right carotid system: Negative right CCA aside from mild proximal tortuosity. Soft and calcified plaque at the right carotid bifurcation and right ICA bulb not resulting in stenosis.  Left carotid system: Negative left CCA origin. Proximal left CCA tortuosity. Mild soft and calcified plaque at the left carotid bifurcation and left ICA bulb with no stenosis.  Vertebral arteries:No proximal subclavian arteries stenosis. Both vertebral artery origins appear within normal limits.  Tortuous proximal vertebral arteries more so the left. Fairly codominant vertebrals. Tortuous distal cervical vertebral arteries, no vertebral artery stenosis in the neck.  Skeleton: No acute osseous abnormality identified. Cervical spine findings are reported separately.  Other neck: Negative lung apices aside from atelectasis. No superior mediastinal lymphadenopathy. Thyroid, larynx, pharynx, parapharyngeal spaces, retropharyngeal space, sublingual space, submandibular glands, and parotid glands are within normal limits. No cervical lymphadenopathy.  CTA HEAD  Posterior circulation: Codominant distal vertebral arteries are patent without  stenosis. Normal PICA origins. Tortuous but otherwise negative vertebrobasilar junction. Tortuous basilar artery without stenosis. SCA and PCA origins are within normal limits. P1 segment tortuosity is specially on the left. Posterior communicating arteries are diminutive or absent. Mild to moderate irregularity of the left P2 segment. Mild to moderate irregularity of  the right P3 branches. Preserved bilateral distal PCA flow.  Anterior circulation: Patent ICA siphons with no siphon stenosis. Calcified siphon plaque more so on the left. Ophthalmic artery origins are within normal limits. Tortuous distal siphons and carotid termini. MCA and ACA origins are within normal limits.  Tortuous proximal ACAs. Diminutive or absent anterior communicating artery. Negative ACA branches aside from tortuosity.  Right MCA branches are normal aside from mild irregularity.  Left MCA M1 segment is mildly irregular. Left MCA bifurcation is patent. Left MCA branches are normal aside from mild irregularity.  Venous sinuses: Within normal limits.  Anatomic variants: None.  IMPRESSION: 1. No carotid or anterior circulation hemodynamically significant stenosis or occlusion. 2. Bilateral PCA atherosclerosis, affecting the left P2 and right P3 segments. No major PCA branch occlusion. 3. Stable CT appearance of the brain since 0750 hours with no acute cortically based infarct or acute intracranial hemorrhage identified. 4. Cervical spine CT reported separately. 5. Obstructive pattern left paranasal sinusitis appears to be acute on chronic. Preliminary report of the vascular findings discussed by telephone with Dr. Thana Farr at [phone] hours on 01/31/2015.   Electronically Signed   By: Odessa Fleming M.D.   On: 01/31/2015 09:35   Ct Cervical Spine Wo Contrast  01/31/2015   CLINICAL DATA:  64 year old male found down with right side weakness. Code stroke. Initial encounter.  EXAM: CT CERVICAL SPINE WITHOUT CONTRAST  TECHNIQUE: Multidetector CT imaging of the cervical spine was performed without intravenous contrast. Multiplanar CT image reconstructions were also generated.  COMPARISON:  Head CT without contrast 0750 hours today. Head and neck CTA reported separately.  FINDINGS: Straightening of cervical lordosis. Cervicothoracic junction alignment is within normal limits. Visualized skull base is intact. No  atlanto-occipital dissociation. Bilateral posterior element alignment is within normal limits. Widespread advanced cervical disc and endplate degeneration, including bulky endplate osteophytosis. No acute cervical spine fracture identified. Grossly intact visualized upper thoracic levels.  Multifactorial mild to moderate degenerative spinal stenosis at C5-C6 and C6-C7. Moderate to severe lower cervical bilateral neural foraminal stenosis. Negative paraspinal soft tissues.  IMPRESSION: 1. No acute fracture or listhesis identified in the cervical spine. Ligamentous injury is not excluded. 2. CTA Head And Neck reported separately. 3. Lower cervical spine degeneration with mild to moderate degenerative spinal stenosis and moderate to severe degenerative biforaminal stenosis.   Electronically Signed   By: Odessa Fleming M.D.   On: 01/31/2015 09:40   Mr Brain Wo Contrast  01/31/2015   CLINICAL DATA:  64 year old male found down with right side weakness. Code stroke. Initial encounter.  EXAM: MRI HEAD WITHOUT CONTRAST  MRA HEAD WITHOUT CONTRAST  TECHNIQUE: Multiplanar, multiecho pulse sequences of the brain and surrounding structures were obtained without intravenous contrast. Angiographic images of the head were obtained using MRA technique without contrast.  COMPARISON:  CTA head and neck from today reported separately.  FINDINGS: MRI HEAD FINDINGS  Bulky dural calcifications with mild susceptibility artifact. No restricted diffusion or evidence of acute infarction.  Major intracranial vascular flow voids are preserved. No midline shift, mass effect, evidence of mass lesion, ventriculomegaly, extra-axial collection or acute intracranial hemorrhage. Cervicomedullary junction and pituitary are within normal limits. Negative  visualized cervical spine.  Evidence of a chronic micro hemorrhage in the inferior right cerebellum is (series 9, image 12) with susceptibility artifact. Elsewhere minimal nonspecific cerebral white matter  T2 and FLAIR hyperintensity the extent of which is within normal limits for age. No cortical encephalomalacia identified.  Visible internal auditory structures appear normal. Mastoids are clear. Negative orbits soft tissues. Bone marrow signal within normal limits. Visualized scalp soft tissues are within normal limits.  Inspissated secretions opacifying/obstructing the left side paranasal sinuses. The left sphenoid sinus is relatively spared. The right paranasal sinuses are clear.  MRA HEAD FINDINGS  Antegrade flow in the posterior circulation with tortuous vertebrobasilar junction. No distal vertebral artery stenosis and normal PICA origins. No basilar artery stenosis. SCA and PCA origins are within normal limits. Tortuous proximal PCAs more so the left. Bilateral P2 and to a lesser extent P 3 PCA irregularity compatible with atherosclerosis. Bilateral preserved distal flow with no high-grade PCA stenosis identified. Posterior communicating arteries are diminutive or absent.  Antegrade flow in both ICA siphons. Tortuous siphons and carotid termini with irregularity but no stenosis. Ophthalmic artery origins are within normal limits. MCA and ACA origins are patent. Proximal ACA is are tortuous. Anterior communicating artery is diminutive or absent. Negative visualized bilateral ACA branches.  Both MCA M1 segments and MCA bifurcations are patent. M1 segment tortuosity and irregularity bilaterally. No major MCA branch occlusion or focal stenosis identified.  IMPRESSION: 1. No acute intracranial abnormality. Largely unremarkable for age non contrast MRI appearance of the brain. 2. Tortuous intracranial arteries with atherosclerosis but no hemodynamically significant intracranial stenosis or major circle of Willis branch occlusion identified. 3. Opacified and obstructed left paranasal sinuses with inspissated secretions/material.   Electronically Signed   By: Odessa Fleming M.D.   On: 01/31/2015 11:54   Mr Maxine Glenn Head/brain Wo  Cm  01/31/2015   CLINICAL DATA:  64 year old male found down with right side weakness. Code stroke. Initial encounter.  EXAM: MRI HEAD WITHOUT CONTRAST  MRA HEAD WITHOUT CONTRAST  TECHNIQUE: Multiplanar, multiecho pulse sequences of the brain and surrounding structures were obtained without intravenous contrast. Angiographic images of the head were obtained using MRA technique without contrast.  COMPARISON:  CTA head and neck from today reported separately.  FINDINGS: MRI HEAD FINDINGS  Bulky dural calcifications with mild susceptibility artifact. No restricted diffusion or evidence of acute infarction.  Major intracranial vascular flow voids are preserved. No midline shift, mass effect, evidence of mass lesion, ventriculomegaly, extra-axial collection or acute intracranial hemorrhage. Cervicomedullary junction and pituitary are within normal limits. Negative visualized cervical spine.  Evidence of a chronic micro hemorrhage in the inferior right cerebellum is (series 9, image 12) with susceptibility artifact. Elsewhere minimal nonspecific cerebral white matter T2 and FLAIR hyperintensity the extent of which is within normal limits for age. No cortical encephalomalacia identified.  Visible internal auditory structures appear normal. Mastoids are clear. Negative orbits soft tissues. Bone marrow signal within normal limits. Visualized scalp soft tissues are within normal limits.  Inspissated secretions opacifying/obstructing the left side paranasal sinuses. The left sphenoid sinus is relatively spared. The right paranasal sinuses are clear.  MRA HEAD FINDINGS  Antegrade flow in the posterior circulation with tortuous vertebrobasilar junction. No distal vertebral artery stenosis and normal PICA origins. No basilar artery stenosis. SCA and PCA origins are within normal limits. Tortuous proximal PCAs more so the left. Bilateral P2 and to a lesser extent P 3 PCA irregularity compatible with atherosclerosis. Bilateral  preserved distal flow with  no high-grade PCA stenosis identified. Posterior communicating arteries are diminutive or absent.  Antegrade flow in both ICA siphons. Tortuous siphons and carotid termini with irregularity but no stenosis. Ophthalmic artery origins are within normal limits. MCA and ACA origins are patent. Proximal ACA is are tortuous. Anterior communicating artery is diminutive or absent. Negative visualized bilateral ACA branches.  Both MCA M1 segments and MCA bifurcations are patent. M1 segment tortuosity and irregularity bilaterally. No major MCA branch occlusion or focal stenosis identified.  IMPRESSION: 1. No acute intracranial abnormality. Largely unremarkable for age non contrast MRI appearance of the brain. 2. Tortuous intracranial arteries with atherosclerosis but no hemodynamically significant intracranial stenosis or major circle of Willis branch occlusion identified. 3. Opacified and obstructed left paranasal sinuses with inspissated secretions/material.   Electronically Signed   By: Odessa Fleming M.D.   On: 01/31/2015 11:54    TEE 02/03/15 Study Conclusions  - Left ventricle: Hypertrophy was noted. Systolic function was normal. The estimated ejection fraction was in the range of 55% to 60%. Wall motion was normal; there were no regional wall motion abnormalities. - Aortic valve: There was trivial regurgitation. - Left atrium: No evidence of thrombus in the atrial cavity or appendage. - Right atrium: No evidence of thrombus in the atrial cavity or appendage. - Atrial septum: No PFO by color / 2D Bubble study with small amount of very late transit to LV suggesting small pulmonary shunt.  Impressions:  - No cardiac source of emboli was indentified.  TTE 02/01/15  Study Conclusions  - Left ventricle: The cavity size was normal. Wall thickness was increased in a pattern of mild LVH. Systolic function was normal. The estimated ejection fraction was in the  range of 60% to 65%. Wall motion was normal; there were no regional wall motion abnormalities. Doppler parameters are consistent with abnormal left ventricular relaxation (grade 1 diastolic dysfunction). - Right atrium: The atrium was mildly dilated. - Pericardium, extracardiac: A trivial pericardial effusion was identified.   Admission HPI: 64yo M w/ PMH HTN (noncompliant with medications) and tobacco abuse presents with R sided weakness and aphasia. He states that the symptoms began yesterday with numbness and weakness in his right face, hand, and leg, and he then developed slurred speech. He declined coming to the ED at that time per pt and his wife. This morning the weakness worsened and he fell but denies LOC or injury. EMS was called and the patient was brought to the ED as a code stroke. In the ED, his speech has improved and his weakness is improving per pt. He states that he is having tingling in his right hand and fingers. Imaging in the ED negative for fractures after fall.   He states that he has been on medication for his blood pressure in the past but has not taken any in years. He does smoke 2-4 cigars a day since 1980. He does not do drugs and only drinks beer approximately once a month. He is on no medications.   He does endorse intermittent bloody stools and abdominal pain with vomiting over the past year. He denies blood on the toilet paper and states that it is in the toilet after bowel movements. He has never had a colonoscopy.  He has a brother with DM and HLD. His father died from an MI and mother of cerebral aneurysm.   Hospital Course by problem list:   TIA (transient ischemic attack)   HTN (hypertension)   Tobacco abuse   Blood  per rectum   TIA: Pt with acute onset R sided weakness and slurred speech which worsened morning of admission that has improved somewhat in the ED. He was outside the tPA window. CT head negative for acute infarct or bleed. In the  setting of uncontrolled HTN and tobacco abuse, he is at an increased risk for stroke. MRI/MRA head/brain neg for acute infarct. CTA neck was neg for carotid stenosis or occlusion. LDL 89 w/ goal less than 70, started pt on lipitor  TTE was WNL. TEE was negative and loop recorder was placed by EP. Troponin x 1 was negative. Pt started on asa 325 daily. Pt clinically improved next day of admission. On day of discharge pt had mild rt cheek numbness but states rt sided strength had improved greatly since admission.   HTN (hypertension): Pt with a h/o HTN and medication non-compliance. Allowed for permissive HTN over the next 48hrs after which patient was started on norvasc  daily.   Blood per rectum: per pt and his wife, has has been experiencing blood in the toilet after a bowel movement intermittently over the past year. He denies constipation or hemorrhoids and states that there is not blood on the toilet paper after wiping. He has never had a colonoscopy. His Hgb was stable throughout hospital course. Pt will need outpatient colonoscopy.  Likely BPH: Pt with urinary frequency and hesitancy with urination. Increased nocturia. He likely has BPH given his age. May benefit from Alpha-1 blocker which provide more immediate results that a 5-alpha-reductase inhibitor, which take months before seeing symptom improvement. Can start flomax as outpatient if indicated.   Discharge Vitals:   BP 145/79 mmHg  Pulse 52  Temp(Src) 98.2 F (36.8 C) (Oral)  Resp 18  Ht  (1.905 m)  Wt 214 lb 9.6 oz (97.342 kg)  BMI 26.82 kg/m2  SpO2 98%  Discharge Labs:  No results found for this or any previous visit (from the past 24 hour(s)).  Signed: Gara Kroner, MD 02/03/2015, 12:58 PM    Services Ordered on Discharge: none Equipment Ordered on Discharge: none

## 2015-02-03 NOTE — Progress Notes (Signed)
Subjective: Pt has no complaints this morning. States rt sided strength has improved and he still has some mild rt cheek numbness.   Objective: Vital signs in last 24 hours: Filed Vitals:   02/03/15 1020 02/03/15 1025 02/03/15 1030 02/03/15 1049  BP: 147/72 135/69 136/74 145/79  Pulse: 51 60 52 52  Temp:    98.2 F (36.8 C)  TempSrc:    Oral  Resp: Height:      Weight:      SpO2: 97% 96% 96% 98%   General: NAD Lungs: CTAB, no wheezing Cardiac: RRR, no murmurs, bandage placed over loop recorder insertion site intact GI: soft, active bowel sounds Neuro: symmetric smile, decreased sensation on rt cheek, mild rt upper extremity strength, +4/5.   Lab Results: Basic Metabolic Panel:  Recent Labs Lab 01/31/15 0744 01/31/15 0749  NA 136 139  K 4.0 4.0  CL 105 103  CO2 25  --   GLUCOSE 100* 104*  BUN 13 16  CREATININE 0.88 0.90  CALCIUM 8.9  --    Liver Function Tests:  Recent Labs Lab 01/31/15 0744  AST 22  ALT 21  ALKPHOS 77  BILITOT 0.8  PROT 7.2  ALBUMIN 4.1   CBC:  Recent Labs Lab 01/31/15 0744 01/31/15 0749  WBC 4.7  --   NEUTROABS 2.2  --   HGB 14.5 16.3  HCT 42.6 48.0  MCV 92.0  --   PLT 150  --    CBG:  Recent Labs Lab 01/31/15 0852  GLUCAP 94   Fasting Lipid Panel:  Recent Labs Lab 02/01/15 0441  CHOL 162  HDL 42  LDLCALC 89  TRIG 153*  CHOLHDL 3.9   Thyroid Function Tests:  Recent Labs Lab 01/31/15 1230  TSH 1.145   Coagulation:  Recent Labs Lab 01/31/15 0744  LABPROT 14.0  INR 1.06   Studies/Results: No results found. Medications: I have reviewed the patient's current medications. Scheduled Meds: . aspirin  300 mg Rectal Daily   Or  . aspirin  325 mg Oral Daily  . atorvastatin  20 mg Oral q1800  . heparin  5,000 Units Subcutaneous 3 times per day   Continuous Infusions: . sodium chloride     PRN Meds:.acetaminophen, senna-docusate Assessment/Plan: Principal Problem:   TIA (transient  ischemic attack) Active Problems:   HTN (hypertension)   Tobacco abuse   Blood per rectum   Stroke   Rt sided weakness--  CTA neck neg for significant carotid stenosis or occlusion, neg for acute infarct. MRI brain neg for acute infarct. Trop x 1 neg - TTE EF 60-65%, neurology recommend TEE and placement of loop recorder if TEE is negative.  - TEE this morning revealed EF of 55-60%, LVH, small pulmonary shunt. Loop recorder placed today, has f/u on 3/7 w/ heart care to check insertion site wound.  -  A1c 5.8 -  LDL 89 w/ goal less than 70, started on lipitor   - asa  daily  HTN: - started on norvasc  qd  Tobacco abuse: Pt smokes 2-4 cigars a day and has since 1980.  - again stressed importance of smoking cessation which pt understands.   Hx of blood per rectum:  - Pt will need outpatient colonoscopy  Likely BPH: Pt with urinary frequency and hesitancy with urination. Increased nocturia. He likely has BPH given his age. UA negative - can start flomax this admission if symptomatic or recommend on discharge summary  #DVT PPx:  [place] [facility]  Dispo: d/c home today.   The patient does not have a current PCP (No primary care provider on file.) and [facility] after discharge.  The patient does not have transportation limitations that hinder transportation to clinic appointments  .Services Needed at time of discharge: Y = Yes, Blank = No PT:   OT:   RN:   Equipment:   Other:     LOS: 3 days   Gara Kroneriana Truong, MD 02/03/2015, 12:18 PM

## 2015-02-03 NOTE — Progress Notes (Signed)
Patient returned from procedure. VSS, see vitals flowsheet. Bandage covering insertion area of loop recorder has small blood dots, as reported from procedural RN. Patient in no distress. Call bell within patient's reach. Will continue to monitor.

## 2015-02-03 NOTE — Interval H&P Note (Signed)
History and Physical Interval Note:  02/03/2015 8:33 AM  Alexander Anthony  has presented today for surgery, with the diagnosis of stroke  The various methods of treatment have been discussed with the patient and family. After consideration of risks, benefits and other options for treatment, the patient has consented to  Procedure(s): TRANSESOPHAGEAL ECHOCARDIOGRAM (TEE) (N/A) as a surgical intervention .  The patient's history has been reviewed, patient examined, no change in status, stable for surgery.  I have reviewed the patient's chart and labs.  Questions were answered to the patient's satisfaction.     Charlton HawsPeter Edwards Mckelvie

## 2015-02-06 ENCOUNTER — Encounter (HOSPITAL_COMMUNITY): Payer: Self-pay | Admitting: Cardiovascular Disease

## 2015-02-13 ENCOUNTER — Ambulatory Visit (INDEPENDENT_AMBULATORY_CARE_PROVIDER_SITE_OTHER): Payer: Medicare Other | Admitting: *Deleted

## 2015-02-13 DIAGNOSIS — I639 Cerebral infarction, unspecified: Secondary | ICD-10-CM

## 2015-02-13 LAB — MDC_IDC_ENUM_SESS_TYPE_INCLINIC
MDC IDC SET ZONE DETECTION INTERVAL: 2000 ms
Zone Setting Detection Interval: 370 ms
Zone Setting Detection Interval: 4500 ms

## 2015-02-13 NOTE — Progress Notes (Signed)
Wound check in clinic s/p ILR insertion. Wound well healed without redness or edema. Device function normal. Pt with 0 tachy episodes; 0 brady episodes; 0 asystole; 0 symptom; 0 AF episodes. Plan to continue [facility] FU QMO and with JA PRN.

## 2015-02-28 ENCOUNTER — Encounter: Payer: Self-pay | Admitting: Internal Medicine

## 2015-03-06 ENCOUNTER — Ambulatory Visit (INDEPENDENT_AMBULATORY_CARE_PROVIDER_SITE_OTHER): Payer: Medicare Other | Admitting: *Deleted

## 2015-03-06 DIAGNOSIS — I639 Cerebral infarction, unspecified: Secondary | ICD-10-CM

## 2015-03-07 NOTE — Progress Notes (Signed)
Loop recorder 

## 2015-04-03 LAB — MDC_IDC_ENUM_SESS_TYPE_REMOTE

## 2015-04-04 ENCOUNTER — Ambulatory Visit (INDEPENDENT_AMBULATORY_CARE_PROVIDER_SITE_OTHER): Payer: Medicare Other | Admitting: *Deleted

## 2015-04-04 DIAGNOSIS — I639 Cerebral infarction, unspecified: Secondary | ICD-10-CM | POA: Diagnosis not present

## 2015-04-05 NOTE — Progress Notes (Signed)
Loop recorder 

## 2015-04-14 ENCOUNTER — Encounter: Payer: Self-pay | Admitting: Internal Medicine

## 2015-05-04 ENCOUNTER — Ambulatory Visit (INDEPENDENT_AMBULATORY_CARE_PROVIDER_SITE_OTHER): Payer: Medicare Other | Admitting: *Deleted

## 2015-05-04 DIAGNOSIS — I639 Cerebral infarction, unspecified: Secondary | ICD-10-CM

## 2015-05-05 NOTE — Progress Notes (Signed)
Loop recorder 

## 2015-05-09 LAB — CUP PACEART REMOTE DEVICE CHECK: Date Time Interrogation Session: 20160531152447

## 2015-05-16 LAB — CUP PACEART REMOTE DEVICE CHECK: Date Time Interrogation Session: 20160607121800

## 2015-05-22 ENCOUNTER — Encounter: Payer: Self-pay | Admitting: Internal Medicine

## 2015-06-02 ENCOUNTER — Ambulatory Visit (INDEPENDENT_AMBULATORY_CARE_PROVIDER_SITE_OTHER): Payer: Medicare Other | Admitting: *Deleted

## 2015-06-02 DIAGNOSIS — I639 Cerebral infarction, unspecified: Secondary | ICD-10-CM

## 2015-06-06 ENCOUNTER — Encounter: Payer: Self-pay | Admitting: Internal Medicine

## 2015-06-06 LAB — CUP PACEART REMOTE DEVICE CHECK: Date Time Interrogation Session: 20160628142832

## 2015-06-07 NOTE — Progress Notes (Signed)
Loop recorder 

## 2015-06-20 ENCOUNTER — Encounter: Payer: Self-pay | Admitting: Internal Medicine

## 2015-07-03 ENCOUNTER — Ambulatory Visit (INDEPENDENT_AMBULATORY_CARE_PROVIDER_SITE_OTHER): Payer: Medicare Other | Admitting: *Deleted

## 2015-07-03 DIAGNOSIS — I639 Cerebral infarction, unspecified: Secondary | ICD-10-CM

## 2015-07-18 NOTE — Progress Notes (Signed)
Loop recorder 

## 2015-07-21 LAB — CUP PACEART REMOTE DEVICE CHECK: Date Time Interrogation Session: 20160812170638

## 2015-07-26 ENCOUNTER — Encounter: Payer: Self-pay | Admitting: Internal Medicine

## 2015-08-02 ENCOUNTER — Ambulatory Visit (INDEPENDENT_AMBULATORY_CARE_PROVIDER_SITE_OTHER): Payer: Medicare Other | Admitting: *Deleted

## 2015-08-02 DIAGNOSIS — I639 Cerebral infarction, unspecified: Secondary | ICD-10-CM | POA: Diagnosis not present

## 2015-08-03 NOTE — Progress Notes (Signed)
Loop recorder 

## 2015-08-07 LAB — CUP PACEART REMOTE DEVICE CHECK: Date Time Interrogation Session: 20160829103018

## 2015-08-16 ENCOUNTER — Encounter: Payer: Self-pay | Admitting: Internal Medicine

## 2015-09-01 ENCOUNTER — Ambulatory Visit (INDEPENDENT_AMBULATORY_CARE_PROVIDER_SITE_OTHER): Payer: Medicare Other | Admitting: *Deleted

## 2015-09-01 DIAGNOSIS — I639 Cerebral infarction, unspecified: Secondary | ICD-10-CM | POA: Diagnosis not present

## 2015-09-07 NOTE — Progress Notes (Signed)
Loop recorder 

## 2015-09-13 LAB — CUP PACEART REMOTE DEVICE CHECK: Date Time Interrogation Session: 20160923160737

## 2015-09-13 NOTE — Progress Notes (Signed)
[facility] summary report received. Battery status OK. Normal device function. No new symptom episodes, tachy episodes, brady, or pause episodes. No new AF episodes. Monthly summary reports and ROV w/ JA PRN. 

## 2015-09-29 ENCOUNTER — Encounter: Payer: Self-pay | Admitting: Internal Medicine

## 2015-10-02 ENCOUNTER — Ambulatory Visit (INDEPENDENT_AMBULATORY_CARE_PROVIDER_SITE_OTHER): Payer: Medicare Other | Admitting: *Deleted

## 2015-10-02 DIAGNOSIS — I638 Other cerebral infarction: Secondary | ICD-10-CM

## 2015-10-02 DIAGNOSIS — I6389 Other cerebral infarction: Secondary | ICD-10-CM

## 2015-10-03 NOTE — Progress Notes (Signed)
Loop recorder 

## 2015-10-31 ENCOUNTER — Ambulatory Visit (INDEPENDENT_AMBULATORY_CARE_PROVIDER_SITE_OTHER): Payer: Medicare Other | Admitting: *Deleted

## 2015-10-31 DIAGNOSIS — I638 Other cerebral infarction: Secondary | ICD-10-CM

## 2015-10-31 DIAGNOSIS — I6389 Other cerebral infarction: Secondary | ICD-10-CM

## 2015-10-31 NOTE — Progress Notes (Signed)
[facility] Summary Report / Loop Recorder 

## 2015-11-03 LAB — CUP PACEART REMOTE DEVICE CHECK: MDC IDC SESS DTM: 20161023163549

## 2015-11-03 NOTE — Progress Notes (Signed)
[facility] summary report received. Battery status OK. Normal device function. No new symptom episodes, tachy episodes, brady, or pause episodes. No new AF episodes. Monthly summary reports and ROV with JA PRN. 

## 2015-11-30 ENCOUNTER — Ambulatory Visit (INDEPENDENT_AMBULATORY_CARE_PROVIDER_SITE_OTHER): Payer: Medicare Other | Admitting: *Deleted

## 2015-11-30 DIAGNOSIS — I638 Other cerebral infarction: Secondary | ICD-10-CM

## 2015-11-30 DIAGNOSIS — I6389 Other cerebral infarction: Secondary | ICD-10-CM

## 2015-11-30 NOTE — Progress Notes (Signed)
[facility] Summary Report / Loop Recorder 

## 2015-12-07 LAB — CUP PACEART REMOTE DEVICE CHECK: MDC IDC SESS DTM: 20161122163814

## 2016-01-01 ENCOUNTER — Ambulatory Visit (INDEPENDENT_AMBULATORY_CARE_PROVIDER_SITE_OTHER): Payer: Medicare Other | Admitting: *Deleted

## 2016-01-01 DIAGNOSIS — I638 Other cerebral infarction: Secondary | ICD-10-CM

## 2016-01-01 DIAGNOSIS — I6389 Other cerebral infarction: Secondary | ICD-10-CM

## 2016-01-02 NOTE — Progress Notes (Signed)
[facility] Summary Report / Loop Recorder 

## 2016-01-13 LAB — CUP PACEART REMOTE DEVICE CHECK: Date Time Interrogation Session: 20161222170825

## 2016-01-24 IMAGING — CT CT HEAD W/O CM
2 series · 16 of 30 positions shown, 18 images · non-contrast
Comparison: None.

CLINICAL DATA: Code stroke, found on the floor, right side weakness

EXAM:
CT HEAD WITHOUT CONTRAST
TECHNIQUE: Contiguous axial images were obtained from the base of the skull
through the vertex without intravenous contrast.

[Series 201: head w/o, idose (1) · axial · non-contrast · 0.49mm/px · z∈[+996,+1136]mm · 8 of 36 slices shown, 10 images]
[im 4/36  brain]
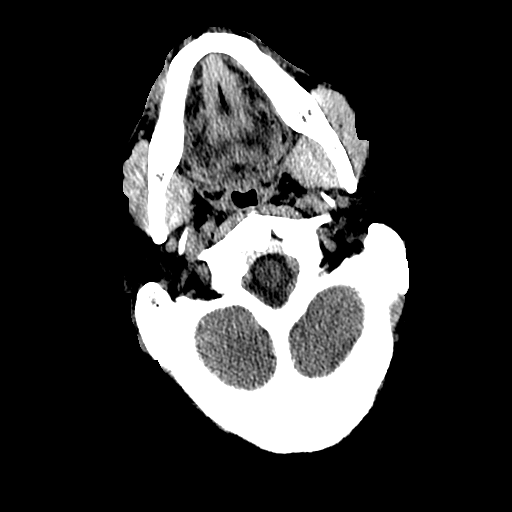
[im 4/36  bone]
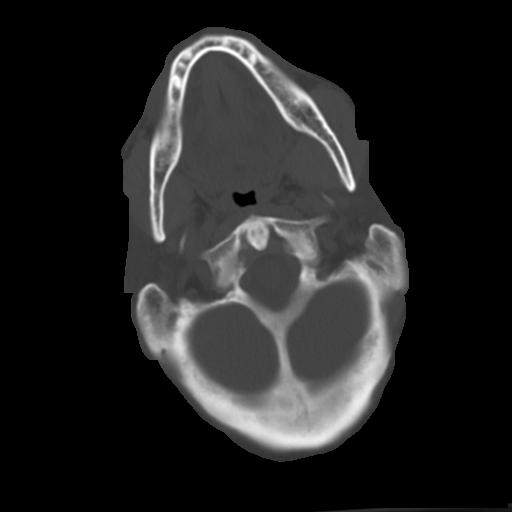
[im 8/36  brain]
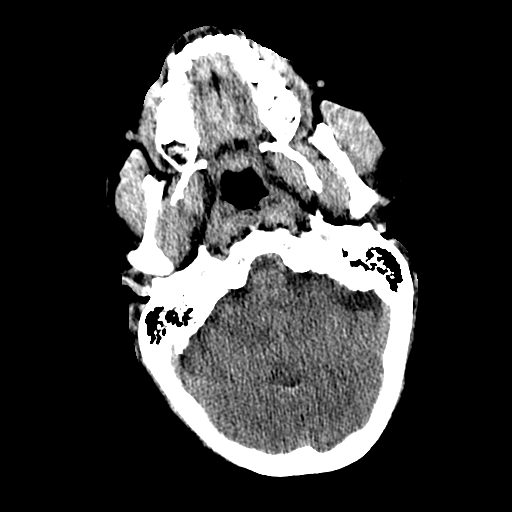
[im 12/36  brain]
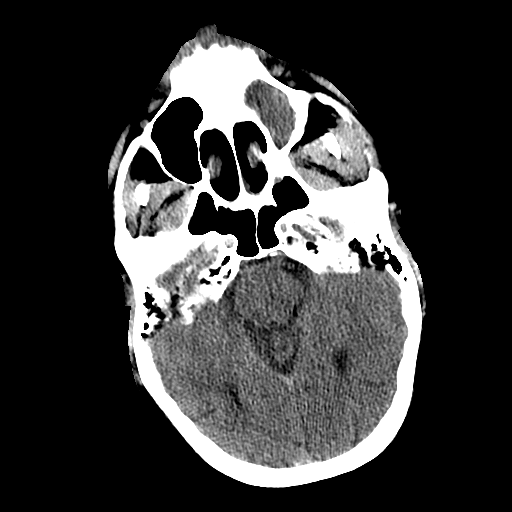
[im 16/36  brain]
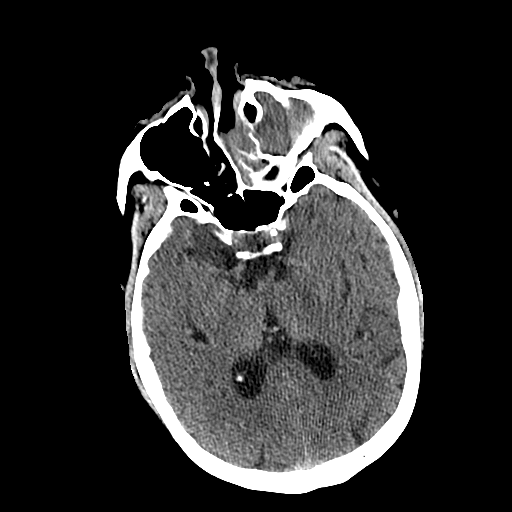
[im 20/36  brain]
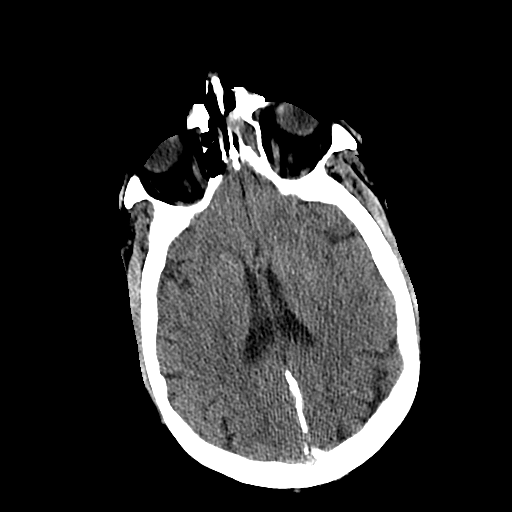
[im 20/36  bone]
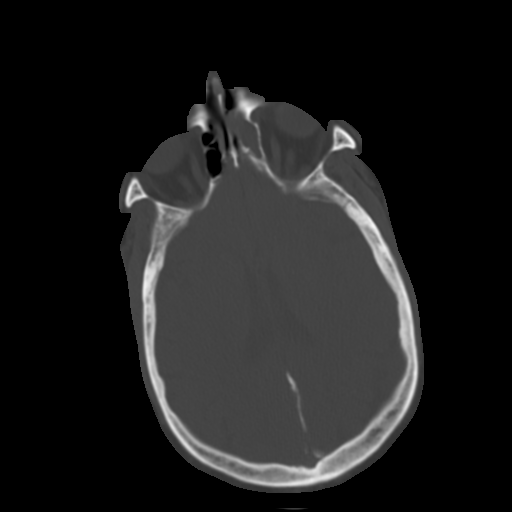
[im 24/36  brain]
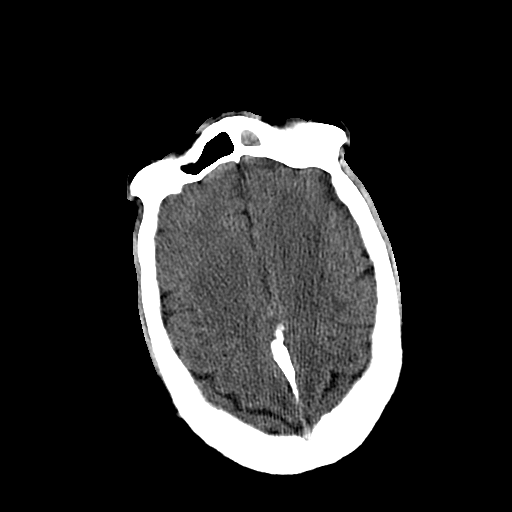
[im 28/36  brain]
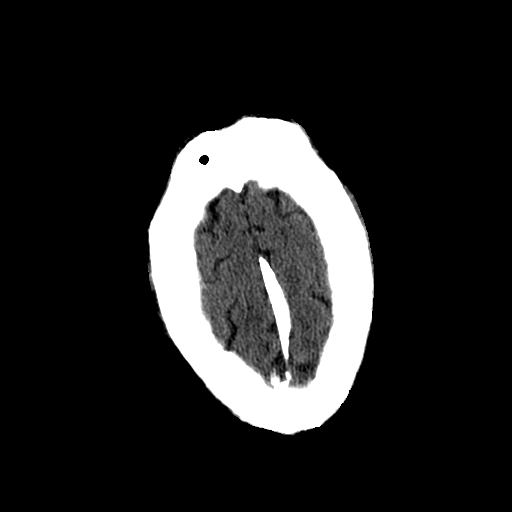
[im 32/36  brain]
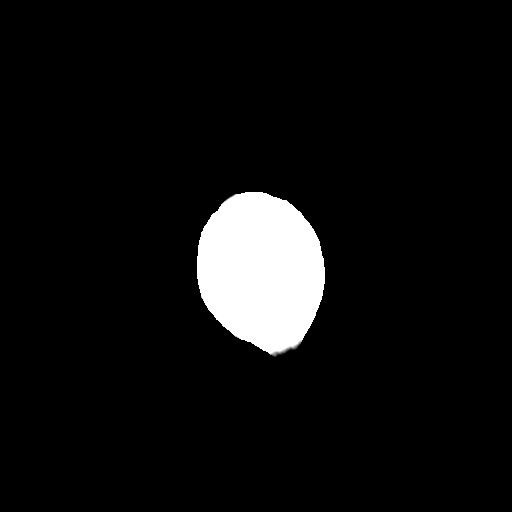

[Series 202: head w/o bone, idose (1) · axial · non-contrast · 0.49mm/px · z∈[+997,+1137]mm · 8 of 72 slices shown]
[im 8/72  bone]
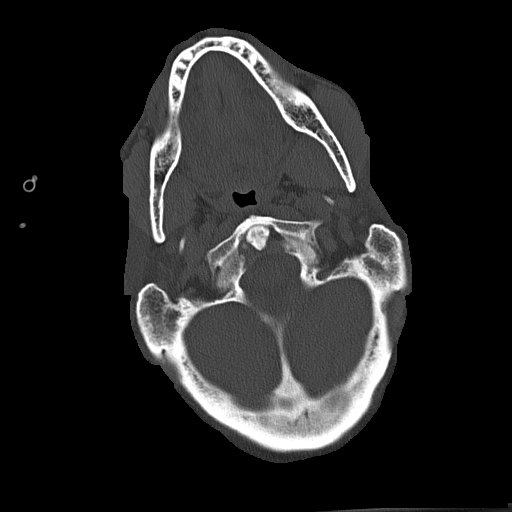
[im 15/72  bone]
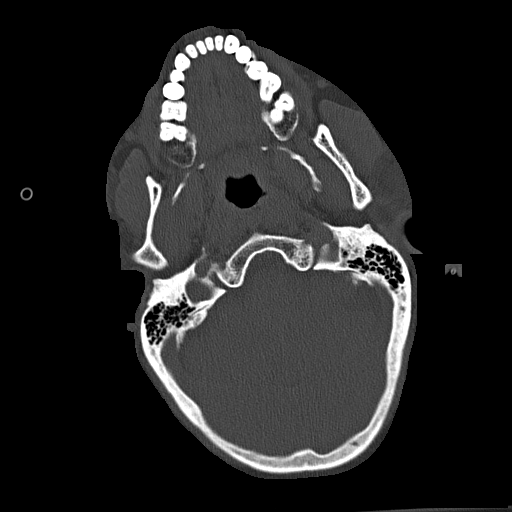
[im 23/72  bone]
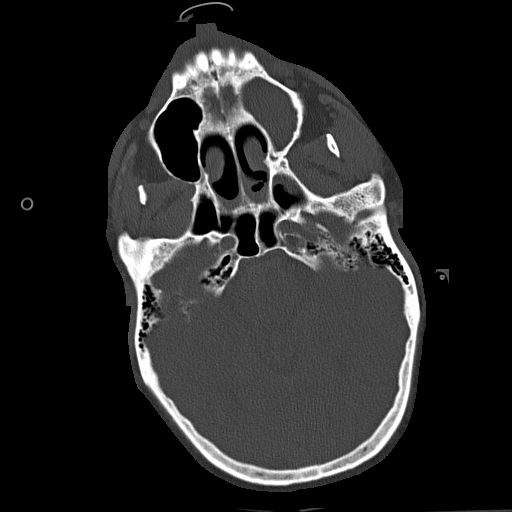
[im 30/72  bone]
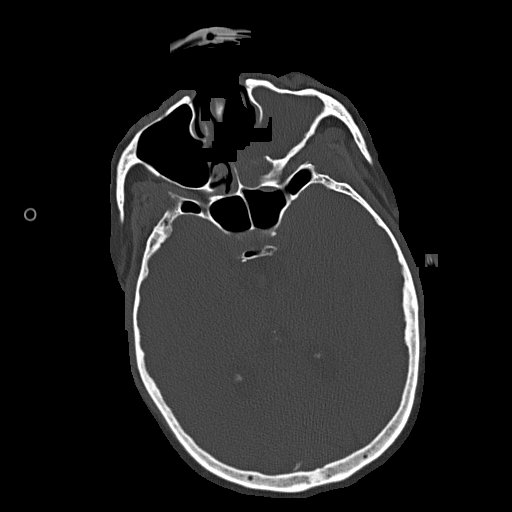
[im 42/72  bone]
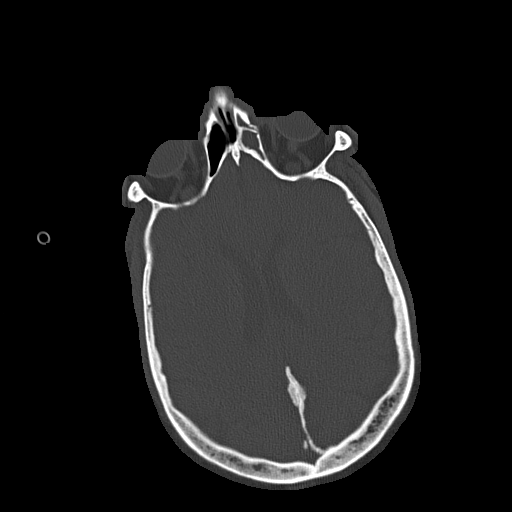
[im 49/72  bone]
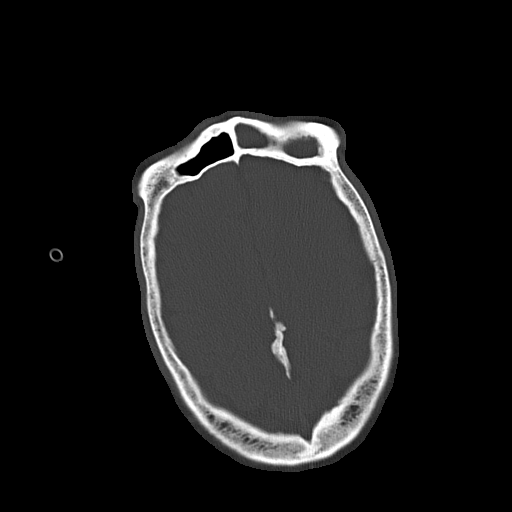
[im 57/72  bone]
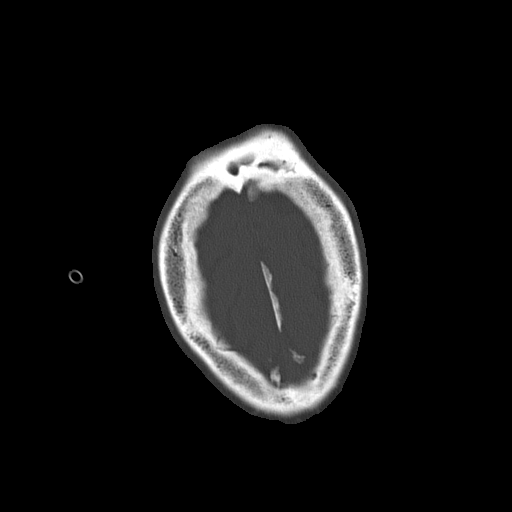
[im 64/72  bone]
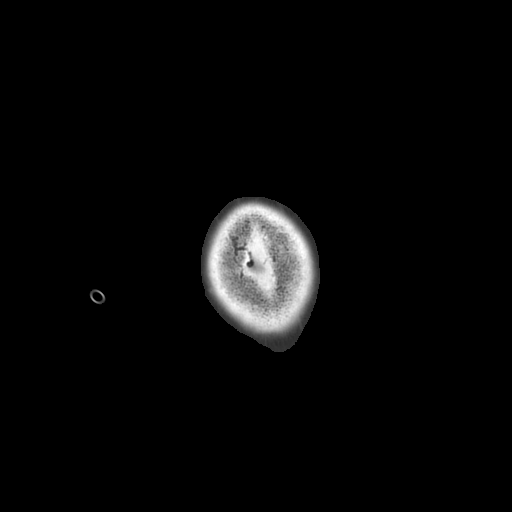

[16 of 30 positions shown; findings below may reference images not displayed]

FINDINGS: No skull fracture is noted. There is mucosal thickening with
complete opacification of the left maxillary sinus. There is
thickening of bony wall of the left maxillary sinus. Mucosal
thickening with partial opacification left ethmoid air cells.
Mucosal thickening left sphenoid sinus. Mucosal thickening with
complete opacification left frontal sinus.

No intracranial hemorrhage, mass effect or midline shift. No
definite acute cortical infarction. No mass lesion is noted on this
unenhanced scan. Mild cerebral atrophy.
IMPRESSION: No definite acute cortical infarction. Mild cerebral atrophy. No
intracranial hemorrhage, mass effect or midline shift. Mucosal
thickening with complete opacification of the left maxillary sinus.
Thickening of bony wall of left maxillary sinus. Mucosal thickening
with partial opacification left ethmoid air cells. Mucosal
thickening left sphenoid sinus. Complete opacification left frontal
sinus.

These results were called by telephone at the time of interpretation
on 01/31/2015 at [DATE] to Dr. Petry, who verbally acknowledged
these results.

## 2016-01-24 IMAGING — CR DG THORACIC SPINE 2V
3 series · 3 of 3 positions shown · non-contrast
Comparison: None.

CLINICAL DATA: Fall, upper back pain

EXAM:
THORACIC SPINE - 2 VIEW

[t thoracic spine ap]
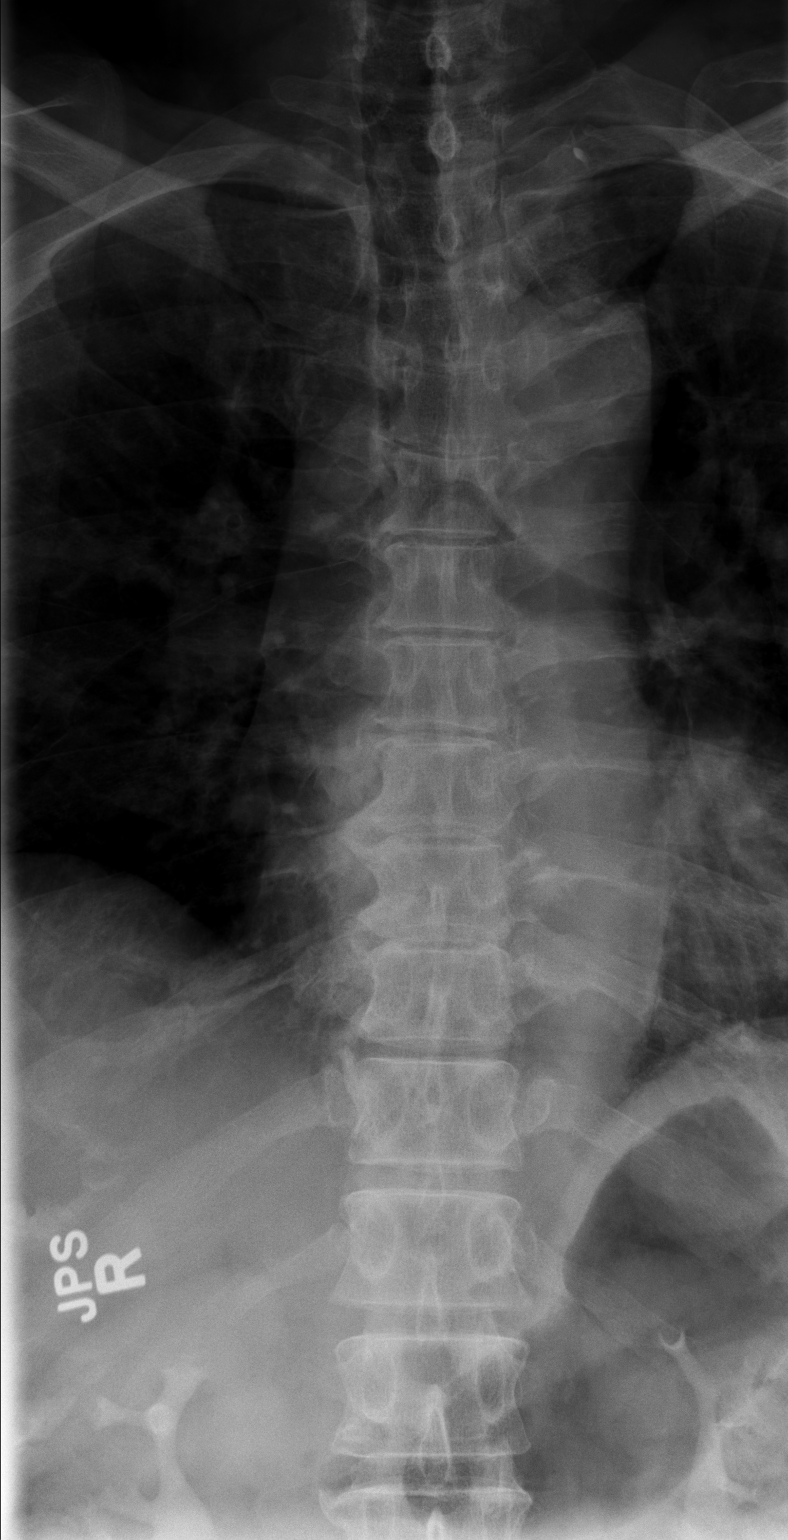

[t thoracic breathing lat]
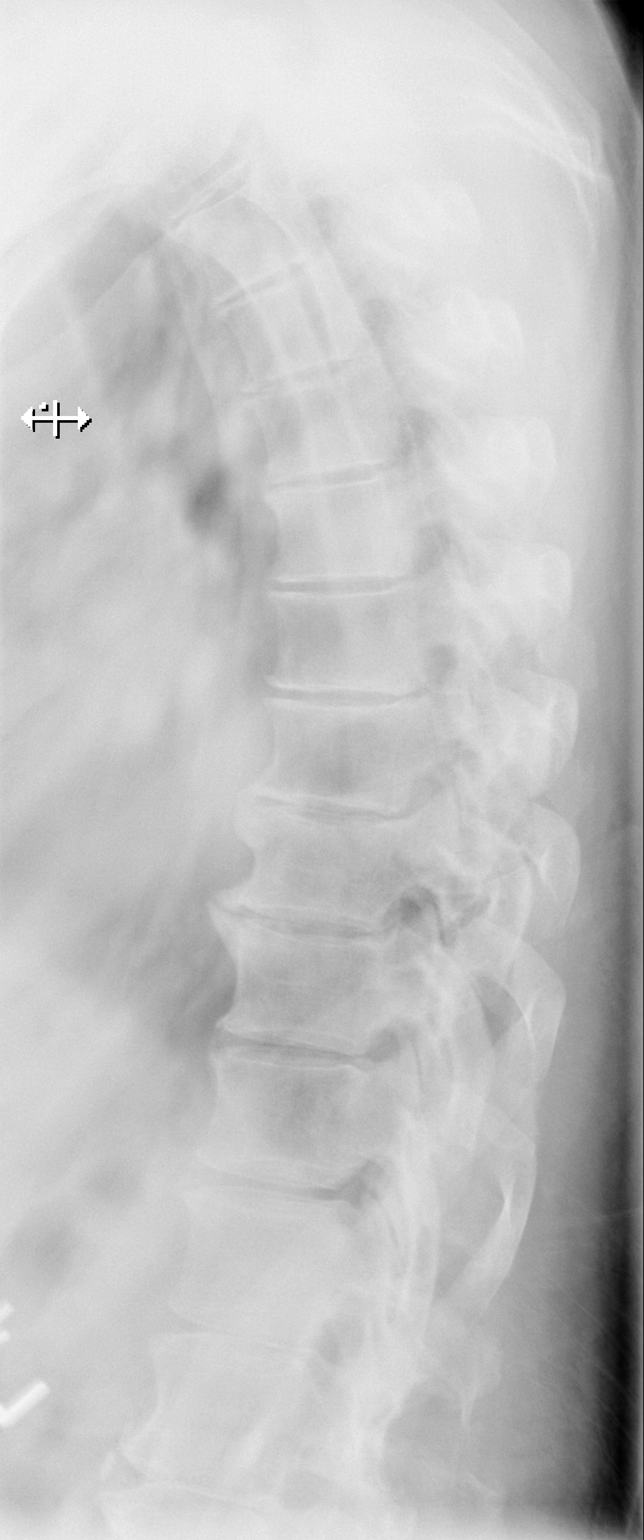

[t thoracic swimmers]
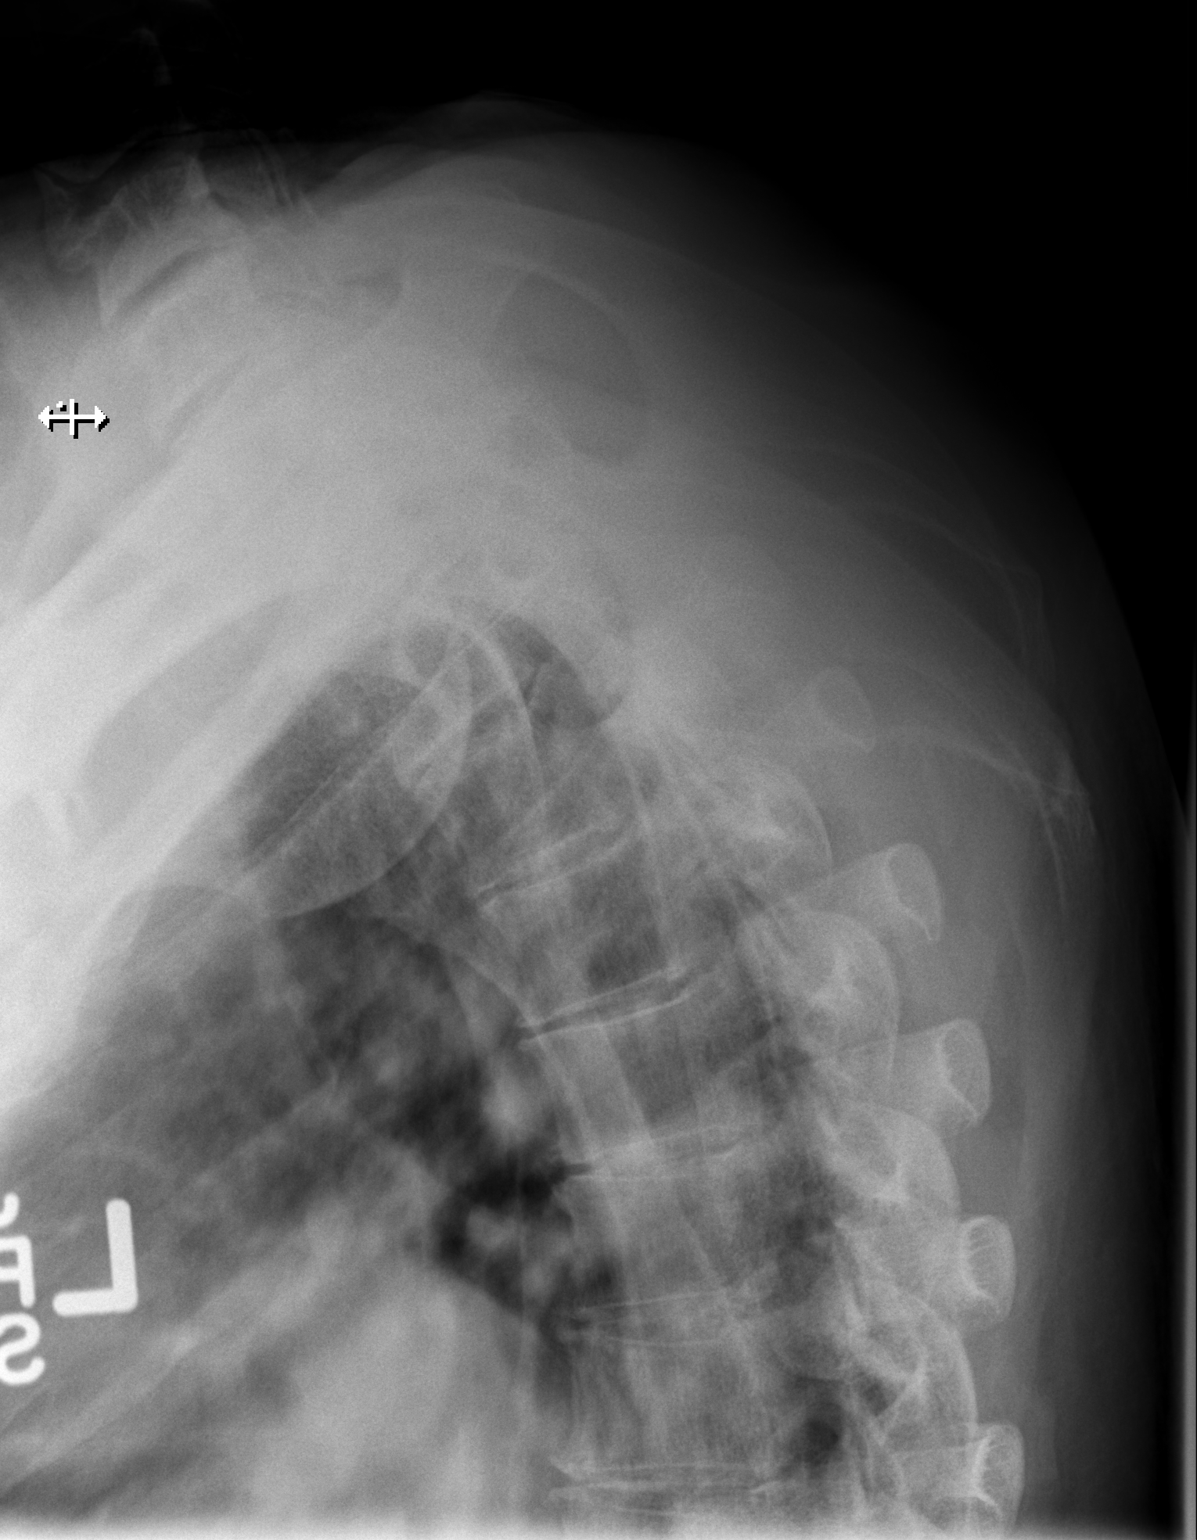

[3 of 3 positions shown; findings below may reference images not displayed]

FINDINGS: Three views of thoracic spine submitted. No acute fracture or
subluxation degenerative changes are noted mid and lower thoracic
spine. Alignment and vertebral body heights are preserved.
IMPRESSION: No acute fracture or subluxation. Degenerative changes mid and lower
thoracic spine.

## 2016-01-29 ENCOUNTER — Ambulatory Visit (INDEPENDENT_AMBULATORY_CARE_PROVIDER_SITE_OTHER): Payer: Medicare Other | Admitting: *Deleted

## 2016-01-29 DIAGNOSIS — I6389 Other cerebral infarction: Secondary | ICD-10-CM

## 2016-01-29 DIAGNOSIS — I638 Other cerebral infarction: Secondary | ICD-10-CM

## 2016-01-29 NOTE — Progress Notes (Signed)
[facility] Summary Report / Loop Recorder 

## 2016-02-12 LAB — CUP PACEART REMOTE DEVICE CHECK: MDC IDC SESS DTM: 20170121170706

## 2016-02-12 NOTE — Progress Notes (Signed)
[facility] summary report received. Battery status OK. Normal device function. No new symptom episodes, tachy episodes, brady, or pause episodes. No new AF episodes. Monthly summary reports and ROV/PRN 

## 2016-02-28 ENCOUNTER — Ambulatory Visit (INDEPENDENT_AMBULATORY_CARE_PROVIDER_SITE_OTHER): Payer: Medicare Other | Admitting: *Deleted

## 2016-02-28 DIAGNOSIS — I6389 Other cerebral infarction: Secondary | ICD-10-CM

## 2016-02-28 DIAGNOSIS — I638 Other cerebral infarction: Secondary | ICD-10-CM | POA: Diagnosis not present

## 2016-02-28 NOTE — Progress Notes (Signed)
[facility] Summary Report / Loop Recorder 

## 2016-03-29 ENCOUNTER — Ambulatory Visit (INDEPENDENT_AMBULATORY_CARE_PROVIDER_SITE_OTHER): Payer: Medicare Other | Admitting: *Deleted

## 2016-03-29 DIAGNOSIS — I638 Other cerebral infarction: Secondary | ICD-10-CM

## 2016-03-29 DIAGNOSIS — I6389 Other cerebral infarction: Secondary | ICD-10-CM

## 2016-03-29 NOTE — Progress Notes (Signed)
[facility] Summary Report / Loop Recorder 

## 2016-04-05 ENCOUNTER — Telehealth: Payer: Self-pay | Admitting: *Deleted

## 2016-04-05 ENCOUNTER — Encounter: Payer: Self-pay | Admitting: Internal Medicine

## 2016-04-05 ENCOUNTER — Encounter: Payer: Self-pay | Admitting: *Deleted

## 2016-04-05 NOTE — Telephone Encounter (Signed)
Attempted patient at home/cell and work numbers.  Home number out of service, and patient has retired per employee at work number.  Attempted at patient's wife's number, but phone number is also out of service.  Will send letter requesting manual transmission.

## 2016-04-12 ENCOUNTER — Encounter: Payer: Self-pay | Admitting: Internal Medicine

## 2016-04-12 ENCOUNTER — Encounter: Payer: Self-pay | Admitting: *Deleted

## 2016-04-12 ENCOUNTER — Encounter: Payer: Self-pay | Admitting: Cardiology

## 2016-04-12 LAB — CUP PACEART REMOTE DEVICE CHECK: MDC IDC SESS DTM: 20170220173812

## 2016-04-29 ENCOUNTER — Ambulatory Visit (INDEPENDENT_AMBULATORY_CARE_PROVIDER_SITE_OTHER): Payer: Medicare Other | Admitting: *Deleted

## 2016-04-29 DIAGNOSIS — I638 Other cerebral infarction: Secondary | ICD-10-CM | POA: Diagnosis not present

## 2016-04-29 DIAGNOSIS — I6389 Other cerebral infarction: Secondary | ICD-10-CM

## 2016-04-29 NOTE — Progress Notes (Signed)
[facility] Summary Report / Loop Recorder 

## 2016-05-04 LAB — CUP PACEART REMOTE DEVICE CHECK: MDC IDC SESS DTM: 20170322180907

## 2016-05-04 NOTE — Progress Notes (Signed)
[facility] summary report received. Battery status OK. Normal device function. No new symptom episodes, tachy episodes, brady, or pause episodes. No new AF episodes. Monthly summary reports and ROV/PRN 

## 2016-05-06 LAB — CUP PACEART REMOTE DEVICE CHECK: MDC IDC SESS DTM: 20170421183532

## 2016-05-06 NOTE — Progress Notes (Signed)
[facility] summary report received. Battery status OK. Normal device function. No new symptom episodes, tachy episodes, brady, or pause episodes. No new AF episodes. Monthly summary reports and ROV/PRN 

## 2016-05-28 ENCOUNTER — Ambulatory Visit (INDEPENDENT_AMBULATORY_CARE_PROVIDER_SITE_OTHER): Payer: Medicare Other | Admitting: *Deleted

## 2016-05-28 DIAGNOSIS — I638 Other cerebral infarction: Secondary | ICD-10-CM | POA: Diagnosis not present

## 2016-05-28 DIAGNOSIS — I6389 Other cerebral infarction: Secondary | ICD-10-CM

## 2016-05-29 NOTE — Progress Notes (Signed)
[facility] Summary Report / Loop Recorder 

## 2016-06-08 LAB — CUP PACEART REMOTE DEVICE CHECK
Date Time Interrogation Session: 20170521183751
MDC IDC SESS DTM: 20170620183730

## 2016-06-27 ENCOUNTER — Ambulatory Visit (INDEPENDENT_AMBULATORY_CARE_PROVIDER_SITE_OTHER): Payer: Medicare Other | Admitting: *Deleted

## 2016-06-27 DIAGNOSIS — I638 Other cerebral infarction: Secondary | ICD-10-CM | POA: Diagnosis not present

## 2016-06-27 DIAGNOSIS — I6389 Other cerebral infarction: Secondary | ICD-10-CM

## 2016-06-28 NOTE — Progress Notes (Signed)
[facility] Summary Report / Loop Recorder 

## 2016-07-18 LAB — CUP PACEART REMOTE DEVICE CHECK: Date Time Interrogation Session: 20170720193543

## 2016-07-29 ENCOUNTER — Ambulatory Visit (INDEPENDENT_AMBULATORY_CARE_PROVIDER_SITE_OTHER): Payer: Medicare Other | Admitting: *Deleted

## 2016-07-29 DIAGNOSIS — I638 Other cerebral infarction: Secondary | ICD-10-CM

## 2016-07-29 DIAGNOSIS — I6389 Other cerebral infarction: Secondary | ICD-10-CM

## 2016-07-29 NOTE — Progress Notes (Signed)
[facility] Summary Report / Loop Report 

## 2016-08-16 ENCOUNTER — Encounter: Payer: Self-pay | Admitting: *Deleted

## 2016-08-26 ENCOUNTER — Ambulatory Visit (INDEPENDENT_AMBULATORY_CARE_PROVIDER_SITE_OTHER): Payer: Medicare Other | Admitting: *Deleted

## 2016-08-26 DIAGNOSIS — I6389 Other cerebral infarction: Secondary | ICD-10-CM

## 2016-08-26 DIAGNOSIS — I638 Other cerebral infarction: Secondary | ICD-10-CM | POA: Diagnosis not present

## 2016-08-26 LAB — CUP PACEART REMOTE DEVICE CHECK: Date Time Interrogation Session: 20170819201038

## 2016-08-26 NOTE — Progress Notes (Signed)
[facility] summary report received. Battery status OK. Normal device function. Fair histograms. No new symptom episodes, tachy episodes, brady, or pause episodes. No new AF episodes. Monthly summary reports and ROV/PRN 

## 2016-08-27 NOTE — Progress Notes (Signed)
[facility] Summary Report / Loop Recorder 

## 2016-09-20 LAB — CUP PACEART REMOTE DEVICE CHECK: Date Time Interrogation Session: 20170918203519

## 2016-09-20 NOTE — Progress Notes (Signed)
[facility] summary report received. Battery status OK. Normal device function. No new symptom episodes, tachy episodes, brady, or pause episodes. No new AF episodes. Monthly summary reports and ROV/PRN 

## 2016-09-25 ENCOUNTER — Ambulatory Visit (INDEPENDENT_AMBULATORY_CARE_PROVIDER_SITE_OTHER): Payer: Medicare Other | Admitting: *Deleted

## 2016-09-25 DIAGNOSIS — I638 Other cerebral infarction: Secondary | ICD-10-CM

## 2016-09-25 DIAGNOSIS — I6389 Other cerebral infarction: Secondary | ICD-10-CM

## 2016-09-26 NOTE — Progress Notes (Signed)
[facility] Summary Report / Loop Recorder 

## 2016-10-25 ENCOUNTER — Ambulatory Visit (INDEPENDENT_AMBULATORY_CARE_PROVIDER_SITE_OTHER): Payer: Medicare Other | Admitting: *Deleted

## 2016-10-25 DIAGNOSIS — I6389 Other cerebral infarction: Secondary | ICD-10-CM

## 2016-10-25 DIAGNOSIS — I638 Other cerebral infarction: Secondary | ICD-10-CM | POA: Diagnosis not present

## 2016-10-26 LAB — CUP PACEART REMOTE DEVICE CHECK
Date Time Interrogation Session: 20171018213525
Implantable Pulse Generator Implant Date: 20160226

## 2016-10-26 NOTE — Progress Notes (Signed)
[facility] summary report received. Battery status OK. Normal device function. No new symptom episodes, tachy episodes, brady, or pause episodes. No new AF episodes. Monthly summary reports and ROV/PRN 

## 2016-10-28 NOTE — Progress Notes (Signed)
[facility] Summary Report / Loop Recorder 

## 2016-11-25 ENCOUNTER — Ambulatory Visit (INDEPENDENT_AMBULATORY_CARE_PROVIDER_SITE_OTHER): Payer: Medicare Other | Admitting: *Deleted

## 2016-11-25 DIAGNOSIS — I6389 Other cerebral infarction: Secondary | ICD-10-CM

## 2016-11-25 DIAGNOSIS — I638 Other cerebral infarction: Secondary | ICD-10-CM | POA: Diagnosis not present

## 2016-11-25 NOTE — Progress Notes (Signed)
[facility] Summary Report / Loop Recorder 

## 2016-12-05 ENCOUNTER — Encounter: Payer: Self-pay | Admitting: *Deleted

## 2016-12-05 LAB — CUP PACEART REMOTE DEVICE CHECK
Date Time Interrogation Session: 20171117223730
MDC IDC PG IMPLANT DT: 20160226

## 2016-12-05 NOTE — Progress Notes (Signed)
Certified letter mailed requesting that patient schedule an appointment with Dr. Johney FrameAllred.  Appointment to discuss A-fib episodes noted on LINQ.

## 2016-12-06 ENCOUNTER — Encounter: Payer: Self-pay | Admitting: Cardiology

## 2016-12-24 ENCOUNTER — Ambulatory Visit (INDEPENDENT_AMBULATORY_CARE_PROVIDER_SITE_OTHER): Payer: Medicare HMO | Admitting: *Deleted

## 2016-12-24 DIAGNOSIS — I638 Other cerebral infarction: Secondary | ICD-10-CM | POA: Diagnosis not present

## 2016-12-24 DIAGNOSIS — I6389 Other cerebral infarction: Secondary | ICD-10-CM

## 2016-12-27 NOTE — Progress Notes (Signed)
[facility] Summary Report / Loop Recorder 

## 2017-01-13 LAB — CUP PACEART REMOTE DEVICE CHECK
Date Time Interrogation Session: 20171217233650
Implantable Pulse Generator Implant Date: 20160226

## 2017-01-23 ENCOUNTER — Ambulatory Visit (INDEPENDENT_AMBULATORY_CARE_PROVIDER_SITE_OTHER): Payer: Medicare HMO | Admitting: *Deleted

## 2017-01-23 DIAGNOSIS — I638 Other cerebral infarction: Secondary | ICD-10-CM

## 2017-01-23 DIAGNOSIS — I6389 Other cerebral infarction: Secondary | ICD-10-CM

## 2017-01-27 NOTE — Progress Notes (Signed)
[facility] Summary Report / Loop Recorder 

## 2017-02-01 LAB — CUP PACEART REMOTE DEVICE CHECK
Implantable Pulse Generator Implant Date: 20160226
MDC IDC SESS DTM: 20180117003831

## 2017-02-13 LAB — CUP PACEART REMOTE DEVICE CHECK
Date Time Interrogation Session: 20180216003816
MDC IDC PG IMPLANT DT: 20160226

## 2017-02-24 ENCOUNTER — Ambulatory Visit (INDEPENDENT_AMBULATORY_CARE_PROVIDER_SITE_OTHER): Payer: Medicare HMO | Admitting: *Deleted

## 2017-02-24 DIAGNOSIS — I638 Other cerebral infarction: Secondary | ICD-10-CM | POA: Diagnosis not present

## 2017-02-24 DIAGNOSIS — I6389 Other cerebral infarction: Secondary | ICD-10-CM

## 2017-02-24 NOTE — Progress Notes (Signed)
[facility] Summary Report / Loop Recorder 

## 2017-03-05 ENCOUNTER — Encounter: Payer: Self-pay | Admitting: Internal Medicine

## 2017-03-05 LAB — CUP PACEART REMOTE DEVICE CHECK
MDC IDC PG IMPLANT DT: 20160226
MDC IDC SESS DTM: 20180318004545

## 2017-03-05 NOTE — Progress Notes (Signed)
Certified letter mailed 12/05/16 requesting follow-up for ILR.  Atrial fibrillation noted on 04/04/16 via ILR home monitor.  Unsuccessful in attempts to reach patient via phone (all numbers disconnected/out of service), MyChart, and multiple letters requesting follow-up.  Certified letter mailed 04/12/16 was signed and returned on 04/17/16 and certified letter mailed 12/05/16 was signed and returned on 12/12/16.  No follow-up was made following either letter.  Will unenroll from [facility] due to no response from patient.     Routed to Dr. Johney FrameAllred for review.

## 2017-03-18 NOTE — Progress Notes (Signed)
Unenrolled from [facility] 03/18/17, return kit ordered to patient's home address on file.

## 2017-05-31 ENCOUNTER — Encounter (HOSPITAL_COMMUNITY): Payer: Self-pay | Admitting: Emergency Medicine

## 2017-05-31 ENCOUNTER — Emergency Department (HOSPITAL_COMMUNITY)
Admission: EM | Admit: 2017-05-31 | Discharge: 2017-05-31 | Disposition: A | Discharge status: Home / Self Care | Payer: Medicare HMO | Attending: Emergency Medicine | Admitting: Emergency Medicine

## 2017-05-31 ENCOUNTER — Emergency Department (HOSPITAL_COMMUNITY): Payer: Medicare HMO

## 2017-05-31 DIAGNOSIS — Y92019 Unspecified place in single-family (private) house as the place of occurrence of the external cause: Secondary | ICD-10-CM | POA: Diagnosis not present

## 2017-05-31 DIAGNOSIS — I10 Essential (primary) hypertension: Secondary | ICD-10-CM | POA: Diagnosis not present

## 2017-05-31 DIAGNOSIS — Z79899 Other long term (current) drug therapy: Secondary | ICD-10-CM | POA: Diagnosis not present

## 2017-05-31 DIAGNOSIS — Z7982 Long term (current) use of aspirin: Secondary | ICD-10-CM | POA: Diagnosis not present

## 2017-05-31 DIAGNOSIS — Y999 Unspecified external cause status: Secondary | ICD-10-CM | POA: Diagnosis not present

## 2017-05-31 DIAGNOSIS — S52044A Nondisplaced fracture of coronoid process of right ulna, initial encounter for closed fracture: Secondary | ICD-10-CM | POA: Diagnosis not present

## 2017-05-31 DIAGNOSIS — R6884 Jaw pain: Secondary | ICD-10-CM | POA: Insufficient documentation

## 2017-05-31 DIAGNOSIS — Y939 Activity, unspecified: Secondary | ICD-10-CM | POA: Insufficient documentation

## 2017-05-31 DIAGNOSIS — F17210 Nicotine dependence, cigarettes, uncomplicated: Secondary | ICD-10-CM | POA: Diagnosis not present

## 2017-05-31 DIAGNOSIS — S0990XA Unspecified injury of head, initial encounter: Secondary | ICD-10-CM | POA: Diagnosis not present

## 2017-05-31 DIAGNOSIS — S52041A Displaced fracture of coronoid process of right ulna, initial encounter for closed fracture: Secondary | ICD-10-CM | POA: Diagnosis not present

## 2017-05-31 DIAGNOSIS — S4991XA Unspecified injury of right shoulder and upper arm, initial encounter: Secondary | ICD-10-CM | POA: Diagnosis not present

## 2017-05-31 DIAGNOSIS — S199XXA Unspecified injury of neck, initial encounter: Secondary | ICD-10-CM | POA: Diagnosis not present

## 2017-05-31 DIAGNOSIS — S0083XA Contusion of other part of head, initial encounter: Secondary | ICD-10-CM | POA: Insufficient documentation

## 2017-05-31 DIAGNOSIS — S0993XA Unspecified injury of face, initial encounter: Secondary | ICD-10-CM | POA: Diagnosis not present

## 2017-05-31 DIAGNOSIS — S59901A Unspecified injury of right elbow, initial encounter: Secondary | ICD-10-CM | POA: Diagnosis present

## 2017-05-31 DIAGNOSIS — R51 Headache: Secondary | ICD-10-CM | POA: Diagnosis not present

## 2017-05-31 MED ORDER — ACETAMINOPHEN 325 MG PO TABS
650.0000 mg | ORAL_TABLET | Freq: Once | ORAL | Status: AC
  Administered 2017-05-31: 650 mg via ORAL
  Filled 2017-05-31: qty 2

## 2017-05-31 MED ORDER — TRAMADOL HCL 50 MG PO TABS
50.0000 mg | ORAL_TABLET | Freq: Once | ORAL | Status: AC
  Administered 2017-05-31: 50 mg via ORAL
  Filled 2017-05-31: qty 1

## 2017-05-31 MED ORDER — TRAMADOL HCL 50 MG PO TABS: 50.0000 mg | ORAL_TABLET | Freq: Four times a day (QID) | ORAL | 0 refills | Status: AC | PRN

## 2017-05-31 NOTE — ED Notes (Signed)
Patient transported to CT/xray 
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
## 2017-05-31 NOTE — Discharge Instructions (Signed)
Wear the splint and sling to protect your elbow injury.  Ice packs applied to your areas of pain for the next few days can help with pain and swelling.  Expect gradual improvement in your facial, jaw and neck pain.   You may take the medicine prescribed if needed for pain - this can make you sleepy - do not drive within 4 hours of taking this medicine.

## 2017-05-31 NOTE — ED Notes (Signed)
ICE pack applied
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
## 2017-06-04 ENCOUNTER — Encounter: Payer: Self-pay | Admitting: Orthopaedic Surgery

## 2017-06-04 ENCOUNTER — Ambulatory Visit (INDEPENDENT_AMBULATORY_CARE_PROVIDER_SITE_OTHER): Payer: Medicare HMO | Admitting: Orthopaedic Surgery

## 2017-06-04 VITALS — BP 163/92 | HR 60 | Temp 97.1°F | Ht 73.5 in | Wt 218.0 lb

## 2017-06-04 DIAGNOSIS — F17210 Nicotine dependence, cigarettes, uncomplicated: Secondary | ICD-10-CM | POA: Diagnosis not present

## 2017-06-04 DIAGNOSIS — S52044A Nondisplaced fracture of coronoid process of right ulna, initial encounter for closed fracture: Secondary | ICD-10-CM

## 2017-06-04 NOTE — Patient Instructions (Addendum)
Steps to Quit Smoking Smoking tobacco can be bad for your health. It can also affect almost every organ in your body. Smoking puts you and people around you at risk for many serious long-lasting (chronic) diseases. Quitting smoking is hard, but it is one of the best things that you can do for your health. It is never too late to quit. What are the benefits of quitting smoking? When you quit smoking, you lower your risk for getting serious diseases and conditions. They can include:  Lung cancer or lung disease.  Heart disease.  Stroke.  Heart attack.  Not being able to have children (infertility).  Weak bones (osteoporosis) and broken bones (fractures).  If you have coughing, wheezing, and shortness of breath, those symptoms may get better when you quit. You may also get sick less often. If you are pregnant, quitting smoking can help to lower your chances of having a baby of low birth weight. What can I do to help me quit smoking? Talk with your doctor about what can help you quit smoking. Some things you can do (strategies) include:  Quitting smoking totally, instead of slowly cutting back how much you smoke over a period of time.  Going to in-person counseling. You are more likely to quit if you go to many counseling sessions.  Using resources and support systems, such as: ? Online chats with a counselor. ? Phone quitlines. ? Printed self-help materials. ? Support groups or group counseling. ? Text messaging programs. ? Mobile phone apps or applications.  Taking medicines. Some of these medicines may have nicotine in them. If you are pregnant or breastfeeding, do not take any medicines to quit smoking unless your doctor says it is okay. Talk with your doctor about counseling or other things that can help you.  Talk with your doctor about using more than one strategy at the same time, such as taking medicines while you are also going to in-person counseling. This can help make  quitting easier. What things can I do to make it easier to quit? Quitting smoking might feel very hard at first, but there is a lot that you can do to make it easier. Take these steps:  Talk to your family and friends. Ask them to support and encourage you.  Call phone quitlines, reach out to support groups, or work with a counselor.  Ask people who smoke to not smoke around you.  Avoid places that make you want (trigger) to smoke, such as: ? Bars. ? Parties. ? Smoke-break areas at work.  Spend time with people who do not smoke.  Lower the stress in your life. Stress can make you want to smoke. Try these things to help your stress: ? Getting regular exercise. ? Deep-breathing exercises. ? Yoga. ? Meditating. ? Doing a body scan. To do this, close your eyes, focus on one area of your body at a time from head to toe, and notice which parts of your body are tense. Try to relax the muscles in those areas.  Download or buy apps on your mobile phone or tablet that can help you stick to your quit plan. There are many free apps, such as QuitGuide from the CDC (Centers for Disease Control and Prevention). You can find more support from smokefree.gov and other websites.  This information is not intended to replace advice given to you by your health care provider. Make sure you discuss any questions you have with your health care provider. Document Released: 09/21/2009 Document   Revised: 07/23/2016 Document Reviewed: 04/11/2015 Elsevier Interactive Patient Education  2018 [employer]  Cast or Splint Care, Adult Casts and splints are supports that are worn to protect broken bones and other injuries. A cast or splint may hold a bone still and in the correct position while it heals. Casts and splints may also help to ease pain, swelling, and muscle spasms. How to care for your cast  Do not stick anything inside the cast to scratch your skin.  Check the skin around the cast every day. Tell  your doctor about any concerns.  You may put lotion on dry skin around the edges of the cast. Do not put lotion on the skin under the cast.  Keep the cast clean.  If the cast is not waterproof: ? Do not let it get wet. ? Cover it with a watertight covering when you take a bath or a shower. How to care for your splint  Wear it as told by your doctor. Take it off only as told by your doctor.  Loosen the splint if your fingers or toes tingle, get numb, or turn cold and blue.  Keep the splint clean.  If the splint is not waterproof: ? Do not let it get wet. ? Cover it with a watertight covering when you take a bath or a shower. Follow these instructions at home: Bathing  Do not take baths or swim until your doctor says it is okay. Ask your doctor if you can take showers. You may only be allowed to take sponge baths for bathing.  If your cast or splint is not waterproof, cover it with a watertight covering when you take a bath or shower. Managing pain, stiffness, and swelling  Move your fingers or toes often to avoid stiffness and to lessen swelling.  Raise (elevate) the injured area above the level of your heart while sitting or lying down. Safety  Do not use the injured limb to support your body weight until your doctor says that it is okay.  Use crutches or other assistive devices as told by your doctor. General instructions  Do not put pressure on any part of the cast or splint until it is fully hardened. This may take many hours.  Return to your normal activities as told by your doctor. Ask your doctor what activities are safe for you.  Keep all follow-up visits as told by your doctor. This is important. Contact a doctor if:  Your cast or splint gets damaged.  The skin around the cast gets red or raw.  The skin under the cast is very itchy or painful.  Your cast or splint feels very uncomfortable.  Your cast or splint is too tight or too loose.  Your cast  becomes wet or it starts to have a soft spot or area.  You get an object stuck under your cast. Get help right away if:  Your pain gets worse.  The injured area tingles, gets numb, or turns blue and cold.  The part of your body above or below the cast is swollen and it turns a different color (is discolored).  You cannot feel or move your fingers or toes.  There is fluid leaking through the cast.  You have very bad pain or pressure under the cast.  You have trouble breathing.  You have shortness of breath.  You have chest pain. This information is not intended to replace advice given to you by your health care provider. Make sure   you discuss any questions you have with your health care provider. Document Released: 03/27/2011 Document Revised: 11/15/2016 Document Reviewed: 11/15/2016 Elsevier Interactive Patient Education  2017 [employer] Inc.  
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
## 2017-06-18 ENCOUNTER — Other Ambulatory Visit: Payer: Self-pay | Admitting: Radiology

## 2017-06-18 ENCOUNTER — Ambulatory Visit: Payer: Medicare HMO | Admitting: Orthopaedic Surgery

## 2017-06-18 DIAGNOSIS — S52044D Nondisplaced fracture of coronoid process of right ulna, subsequent encounter for closed fracture with routine healing: Secondary | ICD-10-CM

## 2017-06-19 ENCOUNTER — Encounter: Payer: Self-pay | Admitting: Orthopaedic Surgery

## 2017-06-19 ENCOUNTER — Ambulatory Visit (INDEPENDENT_AMBULATORY_CARE_PROVIDER_SITE_OTHER): Payer: Self-pay | Admitting: Orthopaedic Surgery

## 2017-06-19 ENCOUNTER — Ambulatory Visit (INDEPENDENT_AMBULATORY_CARE_PROVIDER_SITE_OTHER): Payer: Medicare HMO

## 2017-06-19 DIAGNOSIS — S52044D Nondisplaced fracture of coronoid process of right ulna, subsequent encounter for closed fracture with routine healing: Secondary | ICD-10-CM

## 2017-06-19 DIAGNOSIS — S52044A Nondisplaced fracture of coronoid process of right ulna, initial encounter for closed fracture: Secondary | ICD-10-CM

## 2017-06-19 DIAGNOSIS — F17210 Nicotine dependence, cigarettes, uncomplicated: Secondary | ICD-10-CM

## 2017-06-19 NOTE — Progress Notes (Signed)
CC:  My elbow feels better  He was removed from the cast.  He has good motion of the right elbow lacking about 5 degrees of full extension.  NV intact.  His face swelling has resolved.  X-rays were done, reported separately.  Encounter Diagnoses  Name Primary?  . Closed nondisplaced fracture of coronoid process of right ulna, initial encounter Yes  . Cigarette nicotine dependence without complication    Do gentle ROM exercises.  Return in two weeks.  X-rays of elbow on return right.  Call if any problem.  Precautions discussed.   Electronically Signed Darreld McleanWayne Devaun Hernandez, MD 7/12/20189:35 AM

## 2017-06-19 NOTE — Patient Instructions (Signed)
Steps to Quit Smoking Smoking tobacco can be bad for your health. It can also affect almost every organ in your body. Smoking puts you and people around you at risk for many serious Kienna Moncada-lasting (chronic) diseases. Quitting smoking is hard, but it is one of the best things that you can do for your health. It is never too late to quit. What are the benefits of quitting smoking? When you quit smoking, you lower your risk for getting serious diseases and conditions. They can include:  Lung cancer or lung disease.  Heart disease.  Stroke.  Heart attack.  Not being able to have children (infertility).  Weak bones (osteoporosis) and broken bones (fractures).  If you have coughing, wheezing, and shortness of breath, those symptoms may get better when you quit. You may also get sick less often. If you are pregnant, quitting smoking can help to lower your chances of having a baby of low birth weight. What can I do to help me quit smoking? Talk with your doctor about what can help you quit smoking. Some things you can do (strategies) include:  Quitting smoking totally, instead of slowly cutting back how much you smoke over a period of time.  Going to in-person counseling. You are more likely to quit if you go to many counseling sessions.  Using resources and support systems, such as: ? Online chats with a counselor. ? Phone quitlines. ? Printed self-help materials. ? Support groups or group counseling. ? Text messaging programs. ? Mobile phone apps or applications.  Taking medicines. Some of these medicines may have nicotine in them. If you are pregnant or breastfeeding, do not take any medicines to quit smoking unless your doctor says it is okay. Talk with your doctor about counseling or other things that can help you.  Talk with your doctor about using more than one strategy at the same time, such as taking medicines while you are also going to in-person counseling. This can help make  quitting easier. What things can I do to make it easier to quit? Quitting smoking might feel very hard at first, but there is a lot that you can do to make it easier. Take these steps:  Talk to your family and friends. Ask them to support and encourage you.  Call phone quitlines, reach out to support groups, or work with a counselor.  Ask people who smoke to not smoke around you.  Avoid places that make you want (trigger) to smoke, such as: ? Bars. ? Parties. ? Smoke-break areas at work.  Spend time with people who do not smoke.  Lower the stress in your life. Stress can make you want to smoke. Try these things to help your stress: ? Getting regular exercise. ? Deep-breathing exercises. ? Yoga. ? Meditating. ? Doing a body scan. To do this, close your eyes, focus on one area of your body at a time from head to toe, and notice which parts of your body are tense. Try to relax the muscles in those areas.  Download or buy apps on your mobile phone or tablet that can help you stick to your quit plan. There are many free apps, such as QuitGuide from the CDC (Centers for Disease Control and Prevention). You can find more support from smokefree.gov and other websites.  This information is not intended to replace advice given to you by your health care provider. Make sure you discuss any questions you have with your health care provider. Document Released: 09/21/2009 Document   Revised: 07/23/2016 Document Reviewed: 04/11/2015 Elsevier Interactive Patient Education  2018 Elsevier Inc.  

## 2017-07-03 ENCOUNTER — Ambulatory Visit (INDEPENDENT_AMBULATORY_CARE_PROVIDER_SITE_OTHER): Payer: Self-pay | Admitting: Orthopaedic Surgery

## 2017-07-03 ENCOUNTER — Encounter: Payer: Self-pay | Admitting: Orthopaedic Surgery

## 2017-07-03 ENCOUNTER — Ambulatory Visit (INDEPENDENT_AMBULATORY_CARE_PROVIDER_SITE_OTHER): Payer: Medicare HMO

## 2017-07-03 DIAGNOSIS — S52044D Nondisplaced fracture of coronoid process of right ulna, subsequent encounter for closed fracture with routine healing: Secondary | ICD-10-CM | POA: Diagnosis not present

## 2017-07-03 NOTE — Progress Notes (Signed)
CC:  My arm does not hurt  He has no problem with the right elbow or arm.  NV intact.  He has full ROM.  X-rays were done, reported separately.  Encounter Diagnosis  Name Primary?  . Closed nondisplaced fracture of coronoid process of right ulna with routine healing, subsequent encounter Yes   Discharge.  Call if any problem.  Precautions discussed.    Electronically Signed Darreld McleanWayne Amijah Timothy, MD 7/26/20188:38 AM

## 2020-12-19 ENCOUNTER — Emergency Department (HOSPITAL_COMMUNITY): Payer: Medicare HMO

## 2020-12-19 ENCOUNTER — Encounter (HOSPITAL_COMMUNITY): Payer: Self-pay | Admitting: Emergency Medicine

## 2020-12-19 ENCOUNTER — Inpatient Hospital Stay (HOSPITAL_COMMUNITY)
Admission: EM | Admit: 2020-12-19 | Discharge: 2020-12-20 | DRG: 066 | Discharge status: Against Medical Advice | Payer: Medicare HMO | Attending: Internal Medicine | Admitting: Internal Medicine

## 2020-12-19 ENCOUNTER — Other Ambulatory Visit: Payer: Self-pay

## 2020-12-19 DIAGNOSIS — Z20822 Contact with and (suspected) exposure to covid-19: Secondary | ICD-10-CM | POA: Diagnosis present

## 2020-12-19 DIAGNOSIS — I639 Cerebral infarction, unspecified: Principal | ICD-10-CM | POA: Diagnosis present

## 2020-12-19 DIAGNOSIS — F17290 Nicotine dependence, other tobacco product, uncomplicated: Secondary | ICD-10-CM | POA: Diagnosis not present

## 2020-12-19 DIAGNOSIS — E7800 Pure hypercholesterolemia, unspecified: Secondary | ICD-10-CM | POA: Diagnosis present

## 2020-12-19 DIAGNOSIS — R29700 NIHSS score 0: Secondary | ICD-10-CM | POA: Diagnosis present

## 2020-12-19 DIAGNOSIS — Z9114 Patient's other noncompliance with medication regimen: Secondary | ICD-10-CM | POA: Diagnosis not present

## 2020-12-19 DIAGNOSIS — Z809 Family history of malignant neoplasm, unspecified: Secondary | ICD-10-CM | POA: Diagnosis not present

## 2020-12-19 DIAGNOSIS — Z8249 Family history of ischemic heart disease and other diseases of the circulatory system: Secondary | ICD-10-CM | POA: Diagnosis not present

## 2020-12-19 DIAGNOSIS — I10 Essential (primary) hypertension: Secondary | ICD-10-CM | POA: Diagnosis present

## 2020-12-19 DIAGNOSIS — Z7982 Long term (current) use of aspirin: Secondary | ICD-10-CM | POA: Diagnosis not present

## 2020-12-19 DIAGNOSIS — Z720 Tobacco use: Secondary | ICD-10-CM | POA: Diagnosis present

## 2020-12-19 DIAGNOSIS — R42 Dizziness and giddiness: Secondary | ICD-10-CM | POA: Diagnosis not present

## 2020-12-19 DIAGNOSIS — Z79899 Other long term (current) drug therapy: Secondary | ICD-10-CM

## 2020-12-19 DIAGNOSIS — R531 Weakness: Secondary | ICD-10-CM | POA: Diagnosis not present

## 2020-12-19 DIAGNOSIS — R208 Other disturbances of skin sensation: Secondary | ICD-10-CM | POA: Diagnosis present

## 2020-12-19 DIAGNOSIS — Z5329 Procedure and treatment not carried out because of patient's decision for other reasons: Secondary | ICD-10-CM | POA: Diagnosis present

## 2020-12-19 DIAGNOSIS — Z8673 Personal history of transient ischemic attack (TIA), and cerebral infarction without residual deficits: Secondary | ICD-10-CM | POA: Diagnosis not present

## 2020-12-19 LAB — URINALYSIS, ROUTINE W REFLEX MICROSCOPIC
Bilirubin Urine: NEGATIVE
Glucose, UA: NEGATIVE mg/dL
Hgb urine dipstick: NEGATIVE
Ketones, ur: NEGATIVE mg/dL
Leukocytes,Ua: NEGATIVE
Nitrite: NEGATIVE
Protein, ur: NEGATIVE mg/dL
Specific Gravity, Urine: 1.019 (ref 1.005–1.030)
pH: 6 (ref 5.0–8.0)

## 2020-12-19 LAB — BASIC METABOLIC PANEL
Anion gap: 8 (ref 5–15)
BUN: 15 mg/dL (ref 8–23)
CO2: 26 mmol/L (ref 22–32)
Calcium: 9.1 mg/dL (ref 8.9–10.3)
Chloride: 103 mmol/L (ref 98–111)
Creatinine, Ser: 0.77 mg/dL (ref 0.61–1.24)
GFR, Estimated: >60 mL/min (ref >60)
Glucose, Bld: 106 mg/dL — ABNORMAL HIGH (ref 70–99)
Potassium: 3.7 mmol/L (ref 3.5–5.1)
Sodium: 137 mmol/L (ref 135–145)

## 2020-12-19 LAB — APTT: aPTT: 28 seconds (ref 24–36)

## 2020-12-19 LAB — RAPID URINE DRUG SCREEN, HOSP PERFORMED
Amphetamines: NOT DETECTED
Barbiturates: NOT DETECTED
Benzodiazepines: NOT DETECTED
Cocaine: NOT DETECTED
Opiates: NOT DETECTED
Tetrahydrocannabinol: NOT DETECTED

## 2020-12-19 LAB — DIFFERENTIAL
Abs Immature Granulocytes: 0.02 10*3/uL (ref 0.00–0.07)
Basophils Absolute: 0 10*3/uL (ref 0.0–0.1)
Basophils Relative: 0 %
Eosinophils Absolute: 0.1 10*3/uL (ref 0.0–0.5)
Eosinophils Relative: 1 %
Immature Granulocytes: 0 %
Lymphocytes Relative: 24 %
Lymphs Abs: 2.1 10*3/uL (ref 0.7–4.0)
Monocytes Absolute: 1.1 10*3/uL — ABNORMAL HIGH (ref 0.1–1.0)
Monocytes Relative: 13 %
Neutro Abs: 5.4 10*3/uL (ref 1.7–7.7)
Neutrophils Relative %: 62 %

## 2020-12-19 LAB — PROTIME-INR
INR: 1.1 (ref 0.8–1.2)
Prothrombin Time: 13.4 seconds (ref 11.4–15.2)

## 2020-12-19 LAB — CBC
HCT: 45.3 % (ref 39.0–52.0)
Hemoglobin: 15.1 g/dL (ref 13.0–17.0)
MCH: 31.7 pg (ref 26.0–34.0)
MCHC: 33.3 g/dL (ref 30.0–36.0)
MCV: 95.2 fL (ref 80.0–100.0)
Platelets: 187 10*3/uL (ref 150–400)
RBC: 4.76 MIL/uL (ref 4.22–5.81)
RDW: 13.1 % (ref 11.5–15.5)
WBC: 8.9 10*3/uL (ref 4.0–10.5)
nRBC: 0 % (ref 0.0–0.2)

## 2020-12-19 LAB — RESP PANEL BY RT-PCR (FLU A&B, COVID) ARPGX2
Influenza A by PCR: NEGATIVE
Influenza B by PCR: NEGATIVE
SARS Coronavirus 2 by RT PCR: NEGATIVE

## 2020-12-19 LAB — ETHANOL: Alcohol, Ethyl (B): <10 mg/dL (ref <10)

## 2020-12-19 MED ORDER — ENOXAPARIN SODIUM 40 MG/0.4ML ~~LOC~~ SOLN
40.0000 mg | SUBCUTANEOUS | Status: DC
  Administered 2020-12-19: 40 mg via SUBCUTANEOUS
  Filled 2020-12-19: qty 0.4

## 2020-12-19 MED ORDER — AMLODIPINE BESYLATE 5 MG PO TABS
5.0000 mg | ORAL_TABLET | Freq: Every day | ORAL | Status: DC
  Administered 2020-12-19: 5 mg via ORAL
  Filled 2020-12-19: qty 1

## 2020-12-19 MED ORDER — ACETAMINOPHEN 325 MG PO TABS: 650.0000 mg | ORAL_TABLET | ORAL | Status: DC | PRN

## 2020-12-19 MED ORDER — HYDRALAZINE HCL 20 MG/ML IJ SOLN
10.0000 mg | Freq: Once | INTRAMUSCULAR | Status: AC
  Administered 2020-12-19: 10 mg via INTRAVENOUS
  Filled 2020-12-19: qty 1

## 2020-12-19 MED ORDER — HYDRALAZINE HCL 20 MG/ML IJ SOLN: 10.0000 mg | INTRAMUSCULAR | Status: DC | PRN

## 2020-12-19 MED ORDER — ACETAMINOPHEN 650 MG RE SUPP: 650.0000 mg | RECTAL | Status: DC | PRN

## 2020-12-19 MED ORDER — SENNOSIDES-DOCUSATE SODIUM 8.6-50 MG PO TABS: 1.0000 | ORAL_TABLET | Freq: Every evening | ORAL | Status: DC | PRN

## 2020-12-19 MED ORDER — ASPIRIN 81 MG PO CHEW
324.0000 mg | CHEWABLE_TABLET | Freq: Once | ORAL | Status: AC
  Administered 2020-12-19: 324 mg via ORAL
  Filled 2020-12-19: qty 4

## 2020-12-19 MED ORDER — ATORVASTATIN CALCIUM 40 MG PO TABS
40.0000 mg | ORAL_TABLET | Freq: Every day | ORAL | Status: DC
  Administered 2020-12-19: 40 mg via ORAL
  Filled 2020-12-19: qty 1

## 2020-12-19 MED ORDER — METOPROLOL TARTRATE 5 MG/5ML IV SOLN
5.0000 mg | Freq: Once | INTRAVENOUS | Status: AC
  Administered 2020-12-19: 5 mg via INTRAVENOUS
  Filled 2020-12-19: qty 5

## 2020-12-19 MED ORDER — ACETAMINOPHEN 160 MG/5ML PO SOLN: 650.0000 mg | ORAL | Status: DC | PRN

## 2020-12-19 MED ORDER — STROKE: EARLY STAGES OF RECOVERY BOOK
Freq: Once | Status: DC
  Filled 2020-12-19: qty 1

## 2020-12-19 NOTE — ED Provider Notes (Signed)
[facility] EMERGENCY DEPARTMENT Provider Note   CSN: [id number] Arrival date & time: 12/19/20  1112     History Chief Complaint  Patient presents with  . Dizziness    Alexander ProseRobert W Anthony is a 70 y.o. male with a history of hypertension, reported hypercholesterolemia, prior history of TIA presenting with a 2-day history of dizziness described as room spinning with positional changes.  His symptoms started 2 days ago while he was washing dishes.  He denies nausea, vomiting, headache, also no tinnitus or reduced hearing acuity although he does report a localizing heartbeat in his right ear.  Yesterday evening he noticed numbness in his left arm and left leg when he was going to bed.  He denies weakness in his extremities but has generalized decreased sensation on the left side.  He reports history of vertigo-like symptoms several years ago and was told it was secondary to a "cholesterol" problem.  He is not currently treated for either cholesterol or hypertension.  No current PCP.  He has had no treatments for these new symptoms prior to arrival.  Rest is an alleviating factor, movement makes the dizziness worse.  HPI     Past Medical History:  Diagnosis Date  . Hypertension   . TIA (transient ischemic attack)   . Tobacco abuse     Patient Active Problem List   Diagnosis Date Noted  . Acute CVA (cerebrovascular accident) (HCC) 12/19/2020  . Stroke (HCC)   . TIA (transient ischemic attack) 01/31/2015  . HTN (hypertension) 01/31/2015  . Tobacco abuse 01/31/2015  . Blood per rectum 01/31/2015    Past Surgical History:  Procedure Laterality Date  . CARPAL TUNNEL RELEASE    . KNEE SURGERY     Right  . TEE WITHOUT CARDIOVERSION N/A 02/03/2015   Procedure: TRANSESOPHAGEAL ECHOCARDIOGRAM (TEE);  Surgeon: Wendall StadePeter C Nishan, MD;  Location: [facility] ENDOSCOPY;  Service: Cardiovascular;  Laterality: N/A;       Family History  Problem Relation Age of Onset  . Heart attack Father   . Cerebral  aneurysm Mother   . Cancer Brother     Social History   Tobacco Use  . Smoking status: Current Every Day Smoker    Years: 36.00    Types: Cigars  . Smokeless tobacco: Never Used  . Tobacco comment: cigars x2 a day  Vaping Use  . Vaping Use: Never used  Substance Use Topics  . Alcohol use: No  . Drug use: No    Home Medications Prior to Admission medications   Medication Sig Start Date End Date Taking? Authorizing Provider  amLODipine (NORVASC) 5 MG tablet Take 1 tablet (5 mg total) by mouth daily. 02/03/15   Denton Brickruong, Diana M, MD  aspirin 325 MG tablet Take 1 tablet (325 mg total) by mouth daily. 02/03/15   Denton Brickruong, Diana M, MD  atorvastatin (LIPITOR) 20 MG tablet Take 1 tablet (20 mg total) by mouth daily at 6 PM. 02/03/15   Denton Brickruong, Diana M, MD  loratadine (CLARITIN) 10 MG tablet Take 10 mg by mouth daily as needed for allergies.    [provider]  traMADol (ULTRAM) 50 MG tablet Take 1 tablet (50 mg total) by mouth every 6 (six) hours as needed. 05/31/17   Burgess AmorIdol, Clancey Welton, PA-C    Allergies    Patient has no known allergies.  Review of Systems   Review of Systems  Constitutional: Negative for fever.  HENT: Negative for congestion, hearing loss and sore throat.   Eyes: Negative  for visual disturbance.  Respiratory: Negative for chest tightness and shortness of breath.   Cardiovascular: Negative for chest pain.  Gastrointestinal: Negative for abdominal pain, nausea and vomiting.  Genitourinary: Negative.   Musculoskeletal: Negative for arthralgias, joint swelling and neck pain.  Skin: Negative.  Negative for rash and wound.  Neurological: Positive for dizziness and numbness. Negative for speech difficulty, weakness, light-headedness and headaches.  Psychiatric/Behavioral: Negative.   All other systems reviewed and are negative.   Physical Exam Updated Vital Signs BP (!) 212/199   Pulse (!) 55   Temp 98.1 F (36.7 C) (Oral)   Resp 16   Ht 6\' 2"  (1.88 m)   Wt 102.1  kg   SpO2 100%   BMI 28.89 kg/m   Physical Exam Vitals and nursing note reviewed.  Constitutional:      Appearance: He is well-developed and well-nourished.  HENT:     Head: Normocephalic and atraumatic.  Eyes:     Conjunctiva/sclera: Conjunctivae normal.  Cardiovascular:     Rate and Rhythm: Normal rate and regular rhythm.     Pulses: Intact distal pulses.     Heart sounds: Normal heart sounds.  Pulmonary:     Effort: Pulmonary effort is normal.     Breath sounds: Normal breath sounds. No wheezing.  Abdominal:     General: Bowel sounds are normal.     Palpations: Abdomen is soft.     Tenderness: There is no abdominal tenderness.  Musculoskeletal:        General: Normal range of motion.     Cervical back: Normal range of motion.  Skin:    General: Skin is warm and dry.  Neurological:     Mental Status: He is alert.     Cranial Nerves: Cranial nerves are intact. No dysarthria or facial asymmetry.     Sensory: Sensory deficit present.     Motor: Motor function is intact. No abnormal muscle tone or pronator drift.     Coordination: Finger-Nose-Finger Test and Heel to [place] Test normal.     Comments: Decreased sensation to fine touch left upper and lower extremities, stocking glove distribution .   Able to heel shin test but hesitant and slow using left leg.  Psychiatric:        Mood and Affect: Mood and affect normal.     ED Results / Procedures / Treatments   Labs (all labs ordered are listed, but only abnormal results are displayed) Labs Reviewed  BASIC METABOLIC PANEL - Abnormal; Notable for the following components:      Result Value   Glucose, Bld 106 (*)    All other components within normal limits  DIFFERENTIAL - Abnormal; Notable for the following components:   Monocytes Absolute 1.1 (*)    All other components within normal limits  RESP PANEL BY RT-PCR (FLU A&B, COVID) ARPGX2  CBC  ETHANOL  PROTIME-INR  APTT  URINALYSIS, ROUTINE W REFLEX MICROSCOPIC   RAPID URINE DRUG SCREEN, HOSP PERFORMED    EKG EKG Interpretation  Date/Time:  Tuesday December 19 2020 11:26:01 EST Ventricular Rate:  67 PR Interval:  216 QRS Duration: 80 QT Interval:  402 QTC Calculation: 424 R Axis:   4 Text Interpretation: Sinus rhythm with marked sinus arrhythmia with 1st degree A-V block Nonspecific ST and T wave abnormality Abnormal ECG No significant change since prior 2/16 Confirmed by 3/16 [phone]) on 12/19/2020 3:33:40 PM   Radiology MR ANGIO HEAD WO CONTRAST  Result Date: 12/19/2020 CLINICAL DATA:  Left-sided weakness, dizziness EXAM: MRI HEAD WITHOUT CONTRAST MRA HEAD WITHOUT CONTRAST TECHNIQUE: Multiplanar, multiecho pulse sequences of the brain and surrounding structures were obtained without intravenous contrast. Angiographic images of the head were obtained using MRA technique without contrast. COMPARISON:  2016 CTA, MRI FINDINGS: MRI HEAD Brain: There is right frontal cortical/subcortical variably reduced diffusion centered in the middle frontal gyrus. Additional 1 cm area of restricted diffusion in the right thalamocapsular region. Focus of susceptibility hypointensity is again identified in the inferior right cerebellum most compatible with chronic microhemorrhage. There is no intracranial mass or significant mass effect. Patchy areas of T2 hyperintensity in the supratentorial white matter are nonspecific but probably reflect mild to moderate chronic microvascular ischemic changes. There is no hydrocephalus or extra-axial fluid collection. Vascular: Diminished intracranial right vertebral artery flow void. Diminished left transverse and sigmoid sinus flow void also present on prior study. Skull and upper cervical spine: Normal marrow signal is preserved. Sinuses/Orbits: Chronic left maxillary sinus opacification extending into the nasal cavity. Orbits are unremarkable. Other: Sella is unremarkable.  Mastoid air cells are clear. MRA HEAD Intracranial  internal carotid arteries are patent. Middle and anterior cerebral arteries are patent. Poor flow related enhancement in the region of the right MCA bifurcation may be on a technical basis or reflect presence of stenosis. Intracranial left vertebral artery is patent. Intracranial right vertebral arteries is not visualized the level of patent PICA origin likely reflecting retrograde filling. Basilar artery is patent. Posterior cerebral arteries are patent. A left posterior communicating artery is identified there is no significant stenosis or aneurysm. IMPRESSION: Acute and more subacute cortical/subcortical right frontal infarcts centered in the middle gyrus. Acute right thalamocapsular infarct. Mild to moderate chronic microvascular ischemic changes. Small chronic left pontine infarct. Vessel imaging is degraded by artifact. Poor flow related enhancement at the right MCA bifurcation may be on a technical basis or reflect stenosis. Absent proximal right intracranial vertebral artery (new from 2016) may reflect proximal occlusion or high-grade stenosis. Reconstitution at the PICA origin likely from retrograde flow. Electronically Signed   By: Guadlupe Spanish M.D.   On: 12/19/2020 15:06   MR BRAIN WO CONTRAST  Result Date: 12/19/2020 CLINICAL DATA:  Left-sided weakness, dizziness EXAM: MRI HEAD WITHOUT CONTRAST MRA HEAD WITHOUT CONTRAST TECHNIQUE: Multiplanar, multiecho pulse sequences of the brain and surrounding structures were obtained without intravenous contrast. Angiographic images of the head were obtained using MRA technique without contrast. COMPARISON:  2016 CTA, MRI FINDINGS: MRI HEAD Brain: There is right frontal cortical/subcortical variably reduced diffusion centered in the middle frontal gyrus. Additional 1 cm area of restricted diffusion in the right thalamocapsular region. Focus of susceptibility hypointensity is again identified in the inferior right cerebellum most compatible with chronic  microhemorrhage. There is no intracranial mass or significant mass effect. Patchy areas of T2 hyperintensity in the supratentorial white matter are nonspecific but probably reflect mild to moderate chronic microvascular ischemic changes. There is no hydrocephalus or extra-axial fluid collection. Vascular: Diminished intracranial right vertebral artery flow void. Diminished left transverse and sigmoid sinus flow void also present on prior study. Skull and upper cervical spine: Normal marrow signal is preserved. Sinuses/Orbits: Chronic left maxillary sinus opacification extending into the nasal cavity. Orbits are unremarkable. Other: Sella is unremarkable.  Mastoid air cells are clear. MRA HEAD Intracranial internal carotid arteries are patent. Middle and anterior cerebral arteries are patent. Poor flow related enhancement in the region of the right MCA bifurcation may be on a technical basis or reflect presence  of stenosis. Intracranial left vertebral artery is patent. Intracranial right vertebral arteries is not visualized the level of patent PICA origin likely reflecting retrograde filling. Basilar artery is patent. Posterior cerebral arteries are patent. A left posterior communicating artery is identified there is no significant stenosis or aneurysm. IMPRESSION: Acute and more subacute cortical/subcortical right frontal infarcts centered in the middle gyrus. Acute right thalamocapsular infarct. Mild to moderate chronic microvascular ischemic changes. Small chronic left pontine infarct. Vessel imaging is degraded by artifact. Poor flow related enhancement at the right MCA bifurcation may be on a technical basis or reflect stenosis. Absent proximal right intracranial vertebral artery (new from 2016) may reflect proximal occlusion or high-grade stenosis. Reconstitution at the PICA origin likely from retrograde flow. Electronically Signed   By: Guadlupe Spanish M.D.   On: 12/19/2020 15:06    Procedures Procedures  (including critical care time)  Medications Ordered in ED Medications  aspirin chewable tablet 324 mg (324 mg Oral Given 12/19/20 1601)  metoprolol tartrate (LOPRESSOR) injection 5 mg (5 mg Intravenous Given 12/19/20 1602)    ED Course  I have reviewed the triage vital signs and the nursing notes.  Pertinent labs & imaging results that were available during my care of the patient were reviewed by me and considered in my medical decision making (see chart for details).    MDM Rules/Calculators/A&P                          Patient with acute stroke now 47 hours old, dizziness and left sided numbness without weakness on exam.  Discussed patient's symptoms with Dr. Otelia Limes of neurology who recommends patient would be best served by transfer to [facility] if bed is available, however if there is a delay he would recommend admission to [facility] for full CVA work-up including echocardiogram, CTA head and neck, PT/OT/speech, also recommends daily aspirin 325 and starting patient on a statin.  Also recommends 15% decrease daily in systolic blood pressure to goal of120 to 140 systolic. Pt was given asa here, also given metoprolol 5 mg IV.  Call placed to hospitalist. Discussed with Dr. Mariea Clonts who accepts pt for admission/transfer to [place].     Final Clinical Impression(s) / ED Diagnoses Final diagnoses:  Cerebrovascular accident (CVA), unspecified mechanism [facility])    Rx / DC Orders ED Discharge Orders    None       Victoriano Lain 12/19/20 1645    Bethann Berkshire, MD 12/25/20 [phone]

## 2020-12-19 NOTE — ED Notes (Signed)
[facility] called at this time for transport Alexander Anthony

## 2020-12-19 NOTE — H&P (Signed)
History and Physical    Alexander Anthony HOO:[id number] DOB: May 23, 1951 DOA: 12/19/2020  PCP: Patient, No Pcp Per   Patient coming from: Home I have personally briefly reviewed patient's old medical records in [facility]  Chief Complaint: Dizziness  HPI: Alexander Anthony is a 70 y.o. male with medical history significant for  TIA, HTN, Tobacco use who complains of dizziness left lower extremity numbness and weakness of days duration. Patient started having dizziness on Sunday, 2 days ago.  Dizziness is worse when standing, describes sensation of room spinning, it improves but is still present when he is lying down. He reports onset of left lower extremity numbness and weakness that started yesterday.  He also reports feeling that he is left hand is weird/numb, but denies weakness.  Denies change in vision, no speech abnormalities, no facial asymmetry noticed.  Patient is not on aspirin.  Patient was admitted in 2016 for TIA at that time he had changes in his speech weakness and numbness to her extremities that were transient and resolved.  Had a TEE on that admission and a loop recorder inserted.  He was started on aspirin statins and medication for blood pressure.  He stopped taking this medication a long time ago.  Follow-up notes show multiple unsuccessful attempts to contact patient's for cardiology follow-up due to atrial fibrillation noted 04/04/2016 via ILR monitor.  ED Course: Blood pressure systolic elevated up to 227.  Heart rate mostly 60s, temperature 98.2.  Unremarkable BMP CBC.  MRI brain-and MRA head obtained in ED-was acute on subacute frontal infarcts, acute right thalamocapsular infarct, mild to moderate chronic microvascular ischemic changes.  Patient started therapeutic window. EDP Dr. Neurologist on-call Dr. Otelia Limes, recommended admission to [facility], but due to bed situation patient can be admitted to the pain, will complete stroke work-up with echo, CT head and neck, PT  OT speech therapy, aspirin and statins and to decrease blood pressure daily by 15% to target blood pressure 120 - 140 systolic.  Review of Systems: As per HPI all other systems reviewed and negative.  Past Medical History:  Diagnosis Date  . Hypertension   . TIA (transient ischemic attack)   . Tobacco abuse     Past Surgical History:  Procedure Laterality Date  . CARPAL TUNNEL RELEASE    . KNEE SURGERY     Right  . TEE WITHOUT CARDIOVERSION N/A 02/03/2015   Procedure: TRANSESOPHAGEAL ECHOCARDIOGRAM (TEE);  Surgeon: Wendall Stade, MD;  Location: [facility] ENDOSCOPY;  Service: Cardiovascular;  Laterality: N/A;     reports that he has been smoking cigars. He has smoked for the past 36.00 years. He has never used smokeless tobacco. He reports that he does not drink alcohol and does not use drugs.  No Known Allergies  Family History  Problem Relation Age of Onset  . Heart attack Father   . Cerebral aneurysm Mother   . Cancer Brother     Prior to Admission medications   Medication Sig Start Date End Date Taking? Authorizing Provider  amLODipine (NORVASC) 5 MG tablet Take 1 tablet (5 mg total) by mouth daily. 02/03/15   Denton Brick, MD  aspirin 325 MG tablet Take 1 tablet (325 mg total) by mouth daily. 02/03/15   Denton Brick, MD  atorvastatin (LIPITOR) 20 MG tablet Take 1 tablet (20 mg total) by mouth daily at 6 PM. 02/03/15   Denton Brick, MD  loratadine (CLARITIN) 10 MG tablet Take 10 mg by mouth  daily as needed for allergies.    [provider]  traMADol (ULTRAM) 50 MG tablet Take 1 tablet (50 mg total) by mouth every 6 (six) hours as needed. 05/31/17   Burgess Amor, PA-C    Physical Exam: Vitals:   12/19/20 1445 12/19/20 1500 12/19/20 1530 12/19/20 1602  BP: (!) 161/68 (!) 182/111 (!) 202/113 (!) 212/199  Pulse: 63 62 64 63  Resp: 19 16 (!) 27 18  Temp:      TempSrc:      SpO2: 100% 100% 97% 99%  Weight:      Height:        Constitutional: NAD, calm,  comfortable Vitals:   12/19/20 1445 12/19/20 1500 12/19/20 1530 12/19/20 1602  BP: (!) 161/68 (!) 182/111 (!) 202/113 (!) 212/199  Pulse: 63 62 64 63  Resp: 19 16 (!) 27 18  Temp:      TempSrc:      SpO2: 100% 100% 97% 99%  Weight:      Height:       Eyes: PERRL, lids and conjunctivae normal ENMT: Mucous membranes are moist..  Neck: normal, supple, no masses, no thyromegaly Respiratory: clear to auscultation bilaterally, no wheezing, no crackles. Normal respiratory effort. No accessory muscle use.  Cardiovascular: Regular rate and rhythm. No extremity edema. 2+ pedal pulses. Abdomen: no tenderness, no masses palpated. No hepatosplenomegaly. Bowel sounds positive.  Musculoskeletal: no clubbing / cyanosis. No joint deformity upper and lower extremities. Good ROM, no contractures. Normal muscle tone.  Skin: no rashes, lesions, ulcers. No induration Neurologic:     Mental Status: he is alert.     GCS: GCS eye subscore is 4. GCS verbal subscore is 5. GCS motor subscore is 6.     Comments: Mental Status:  Alert, oriented, thought content appropriate, able to give a coherent history. Speech fluent without evidence of aphasia. Able to follow 2 step commands without difficulty.  Cranial Nerves:  II:  Peripheral visual fields grossly normal, pupils equal, round, reactive to light III,IV, VI: ptosis not present, extra-ocular motions intact bilaterally  V,VII: at rest has flattening of left nasolabial fold that  disappears with smiling, otherwise, smile symmetric, eyebrows raise symmetric,  VIII: hearing grossly normal to voice  X: not tested  XI: bilateral shoulder shrug symmetric and strong XII: midline tongue extension without fassiculations Motor:  Strenght LLE- 4/5,barely able to lift against gravity, 5/5 strength in bilat upper extremities, RLE. Good grip strenght bilat. Sensory: Sensation subjectively impaired Left upper and lower extremities.  CV: distal pulses palpable throughout    Psychiatric: Normal judgment and insight. Alert and oriented x 3. Normal mood.   Labs on Admission: I have personally reviewed following labs and imaging studies  CBC: Recent Labs  Lab 12/19/20 1207  WBC 8.9  NEUTROABS 5.4  HGB 15.1  HCT 45.3  MCV 95.2  PLT 187   Basic Metabolic Panel: Recent Labs  Lab 12/19/20 1207  NA 137  K 3.7  CL 103  CO2 26  GLUCOSE 106*  BUN 15  CREATININE 0.77  CALCIUM 9.1   Coagulation Profile: Recent Labs  Lab 12/19/20 1207  INR 1.1   CRadiological Exams on Admission: MR ANGIO HEAD WO CONTRAST  Result Date: 12/19/2020 CLINICAL DATA:  Left-sided weakness, dizziness EXAM: MRI HEAD WITHOUT CONTRAST MRA HEAD WITHOUT CONTRAST TECHNIQUE: Multiplanar, multiecho pulse sequences of the brain and surrounding structures were obtained without intravenous contrast. Angiographic images of the head were obtained using MRA technique without contrast. COMPARISON:  2016 CTA, MRI FINDINGS: MRI HEAD Brain: There is right frontal cortical/subcortical variably reduced diffusion centered in the middle frontal gyrus. Additional 1 cm area of restricted diffusion in the right thalamocapsular region. Focus of susceptibility hypointensity is again identified in the inferior right cerebellum most compatible with chronic microhemorrhage. There is no intracranial mass or significant mass effect. Patchy areas of T2 hyperintensity in the supratentorial white matter are nonspecific but probably reflect mild to moderate chronic microvascular ischemic changes. There is no hydrocephalus or extra-axial fluid collection. Vascular: Diminished intracranial right vertebral artery flow void. Diminished left transverse and sigmoid sinus flow void also present on prior study. Skull and upper cervical spine: Normal marrow signal is preserved. Sinuses/Orbits: Chronic left maxillary sinus opacification extending into the nasal cavity. Orbits are unremarkable. Other: Sella is unremarkable.   Mastoid air cells are clear. MRA HEAD Intracranial internal carotid arteries are patent. Middle and anterior cerebral arteries are patent. Poor flow related enhancement in the region of the right MCA bifurcation may be on a technical basis or reflect presence of stenosis. Intracranial left vertebral artery is patent. Intracranial right vertebral arteries is not visualized the level of patent PICA origin likely reflecting retrograde filling. Basilar artery is patent. Posterior cerebral arteries are patent. A left posterior communicating artery is identified there is no significant stenosis or aneurysm. IMPRESSION: Acute and more subacute cortical/subcortical right frontal infarcts centered in the middle gyrus. Acute right thalamocapsular infarct. Mild to moderate chronic microvascular ischemic changes. Small chronic left pontine infarct. Vessel imaging is degraded by artifact. Poor flow related enhancement at the right MCA bifurcation may be on a technical basis or reflect stenosis. Absent proximal right intracranial vertebral artery (new from 2016) may reflect proximal occlusion or high-grade stenosis. Reconstitution at the PICA origin likely from retrograde flow. Electronically Signed   By: Guadlupe Spanish M.D.   On: 12/19/2020 15:06   MR BRAIN WO CONTRAST  Result Date: 12/19/2020 CLINICAL DATA:  Left-sided weakness, dizziness EXAM: MRI HEAD WITHOUT CONTRAST MRA HEAD WITHOUT CONTRAST TECHNIQUE: Multiplanar, multiecho pulse sequences of the brain and surrounding structures were obtained without intravenous contrast. Angiographic images of the head were obtained using MRA technique without contrast. COMPARISON:  2016 CTA, MRI FINDINGS: MRI HEAD Brain: There is right frontal cortical/subcortical variably reduced diffusion centered in the middle frontal gyrus. Additional 1 cm area of restricted diffusion in the right thalamocapsular region. Focus of susceptibility hypointensity is again identified in the inferior  right cerebellum most compatible with chronic microhemorrhage. There is no intracranial mass or significant mass effect. Patchy areas of T2 hyperintensity in the supratentorial white matter are nonspecific but probably reflect mild to moderate chronic microvascular ischemic changes. There is no hydrocephalus or extra-axial fluid collection. Vascular: Diminished intracranial right vertebral artery flow void. Diminished left transverse and sigmoid sinus flow void also present on prior study. Skull and upper cervical spine: Normal marrow signal is preserved. Sinuses/Orbits: Chronic left maxillary sinus opacification extending into the nasal cavity. Orbits are unremarkable. Other: Sella is unremarkable.  Mastoid air cells are clear. MRA HEAD Intracranial internal carotid arteries are patent. Middle and anterior cerebral arteries are patent. Poor flow related enhancement in the region of the right MCA bifurcation may be on a technical basis or reflect presence of stenosis. Intracranial left vertebral artery is patent. Intracranial right vertebral arteries is not visualized the level of patent PICA origin likely reflecting retrograde filling. Basilar artery is patent. Posterior cerebral arteries are patent. A left posterior communicating artery is identified there  is no significant stenosis or aneurysm. IMPRESSION: Acute and more subacute cortical/subcortical right frontal infarcts centered in the middle gyrus. Acute right thalamocapsular infarct. Mild to moderate chronic microvascular ischemic changes. Small chronic left pontine infarct. Vessel imaging is degraded by artifact. Poor flow related enhancement at the right MCA bifurcation may be on a technical basis or reflect stenosis. Absent proximal right intracranial vertebral artery (new from 2016) may reflect proximal occlusion or high-grade stenosis. Reconstitution at the PICA origin likely from retrograde flow. Electronically Signed   By: Guadlupe SpanishPraneil  Patel M.D.   On:  12/19/2020 15:06    EKG: Independently reviewed.  Sinus Rhythm, with first-degree AV block rate 67.  PR interval 216.  QTc 424. EKG shows first-degree AV block.  Otherwise no significant ST or T wave changes from prior EKG.  Assessment/Plan Principal Problem:   Acute CVA (cerebrovascular accident) (HCC) Active Problems:   HTN (hypertension)   Tobacco abuse  Acute/subacute CVA-presenting with dizziness, left lower extremity weakness and numbness, right upper extremity numbness.  Uncontrolled hypertension, noncompliance with medications.  Not on aspirin. - MRI brain shows multiple infarcts cortical/subcortical right frontal, right thalamocapsular, and small chronic left pontine infarct.  Also mild to moderate chronic microvascular changes seen.   - MRA brain degraded by artifact,Poor flow related enhancement at the right MCA bifurcation may be on a technical basis or reflect stenosis. Absent proximal right intracranial vertebral artery (new from 2016) may reflect proximal occlusion or high-grade stenosis -TIA admission 2016, loop recorder inserted, per notes patient had episode of atrial fibrillation, multiple unsuccessful attempts to reach patient. - EDP talked with Dr. Otelia LimesLindzen, recommended aspirin, [facility], if no beds may need to complete work-up at [facility].  - [facility]cons[facility] neurology on [facility] - 324 mg aspirin given x1, continue 81 mg daily -Atorvastatin 40 mg daily -PT OT speech therapy evaluation -Echocardiogram -CTA neck as recommended by neurology -Monitor on telemetry -If [facility], may need to reach out to neurology-decision to use antiplatelet versus anticoagulation. -Lipid panel, hemoglobin A1c in a.m. -Bedside swallow evaluation  HTN-elevated, systolic up to 227 - ~ 48hrs since onset of symptoms, will start Norvasc at 5 mg daily -As needed hydralazine for systolic greater than 200  Tobacco use, smokes cigars.  DVT  prophylaxis: Lovenox Code Status:  Full code Family Communication: None at bedside Disposition Plan:  ~ 1 - 2 days Consults called: Neurology Admission status: Inpatient, telemetry I certify that at the point of admission it is my clinical judgment that the patient will require inpatient hospital care spanning beyond 2 midnights from the point of admission due to high intensity of service, high risk for further deterioration and high frequency of surveillance required. The following factors support the patient status of inpatient: Acute and subacute stroke evidenced on MRI, requiring stroke work-up.   Alexander BoerEjiroghene E Aiyden Lauderback MD [facility]  12/19/2020, 6:50 PM

## 2020-12-19 NOTE — ED Triage Notes (Signed)
Pt states he has been dizzy for the last several days with balance issues.  Pt denies known sickness, N/V/D/F   NAD.

## 2020-12-20 LAB — LIPID PANEL
Cholesterol: 216 mg/dL — ABNORMAL HIGH (ref 0–200)
HDL: 57 mg/dL (ref >40)
LDL Cholesterol: 134 mg/dL — ABNORMAL HIGH (ref 0–99)
Total CHOL/HDL Ratio: 3.8 RATIO
Triglycerides: 126 mg/dL (ref <150)
VLDL: 25 mg/dL (ref 0–40)

## 2020-12-20 LAB — HEMOGLOBIN A1C
Hgb A1c MFr Bld: 5.4 % (ref 4.8–5.6)
Mean Plasma Glucose: 108.28 mg/dL

## 2020-12-20 NOTE — ED Notes (Signed)
Pt called RN in the room, pt sitting on side of bed, monitoring equipment removed. Pt stating that he is calling his wife to come and pick him up and wants to "be discharged". Explained to the patient that he would have to sign out AMA. Pt says he has "understanding of leaving abruptly, there are risk, and that are more test to be done" but he would like to leave at this time. Reinforced to patient potential risk of leaving. Notified, [facility] hosptialist of pt wishes, pt to sign out AMA. He has called his wife who is coming to pick him up.

## 2020-12-27 DIAGNOSIS — I10 Essential (primary) hypertension: Secondary | ICD-10-CM | POA: Diagnosis not present

## 2020-12-27 DIAGNOSIS — R42 Dizziness and giddiness: Secondary | ICD-10-CM | POA: Diagnosis not present

## 2020-12-27 DIAGNOSIS — Z7189 Other specified counseling: Secondary | ICD-10-CM | POA: Diagnosis not present

## 2020-12-27 DIAGNOSIS — Z125 Encounter for screening for malignant neoplasm of prostate: Secondary | ICD-10-CM | POA: Diagnosis not present

## 2021-05-17 ENCOUNTER — Other Ambulatory Visit: Payer: Self-pay

## 2021-05-17 ENCOUNTER — Ambulatory Visit
Admission: RE | Admit: 2021-05-17 | Discharge: 2021-05-17 | Disposition: A | Discharge status: Home / Self Care | Payer: Medicare HMO | Source: Ambulatory Visit | Attending: Nurse Practitioner | Admitting: Nurse Practitioner

## 2021-05-17 DIAGNOSIS — I10 Essential (primary) hypertension: Secondary | ICD-10-CM | POA: Diagnosis not present

## 2021-05-17 DIAGNOSIS — R634 Abnormal weight loss: Secondary | ICD-10-CM

## 2021-05-17 DIAGNOSIS — R350 Frequency of micturition: Secondary | ICD-10-CM | POA: Diagnosis not present

## 2021-05-17 DIAGNOSIS — Z125 Encounter for screening for malignant neoplasm of prostate: Secondary | ICD-10-CM | POA: Diagnosis not present

## 2021-05-17 DIAGNOSIS — Z7189 Other specified counseling: Secondary | ICD-10-CM | POA: Diagnosis not present

## 2021-05-17 DIAGNOSIS — R638 Other symptoms and signs concerning food and fluid intake: Secondary | ICD-10-CM | POA: Diagnosis not present

## 2021-06-19 DIAGNOSIS — Z720 Tobacco use: Secondary | ICD-10-CM | POA: Diagnosis not present

## 2021-06-19 DIAGNOSIS — I10 Essential (primary) hypertension: Secondary | ICD-10-CM | POA: Diagnosis not present

## 2021-06-19 DIAGNOSIS — K5900 Constipation, unspecified: Secondary | ICD-10-CM | POA: Diagnosis not present

## 2021-09-03 DIAGNOSIS — I10 Essential (primary) hypertension: Secondary | ICD-10-CM | POA: Diagnosis not present

## 2021-09-03 DIAGNOSIS — Z6825 Body mass index (BMI) 25.0-25.9, adult: Secondary | ICD-10-CM | POA: Diagnosis not present

## 2021-09-07 ENCOUNTER — Other Ambulatory Visit (HOSPITAL_COMMUNITY): Payer: Self-pay | Admitting: Family Medicine

## 2021-09-07 DIAGNOSIS — J9811 Atelectasis: Secondary | ICD-10-CM

## 2021-09-17 DIAGNOSIS — I10 Essential (primary) hypertension: Secondary | ICD-10-CM | POA: Diagnosis not present

## 2021-09-17 DIAGNOSIS — Z720 Tobacco use: Secondary | ICD-10-CM | POA: Diagnosis not present

## 2021-11-13 DIAGNOSIS — I10 Essential (primary) hypertension: Secondary | ICD-10-CM | POA: Diagnosis not present

## 2021-11-13 DIAGNOSIS — M79602 Pain in left arm: Secondary | ICD-10-CM | POA: Diagnosis not present

## 2021-11-13 DIAGNOSIS — R202 Paresthesia of skin: Secondary | ICD-10-CM | POA: Diagnosis not present

## 2021-11-19 ENCOUNTER — Other Ambulatory Visit (HOSPITAL_COMMUNITY): Payer: Self-pay | Admitting: Family Medicine

## 2021-11-19 ENCOUNTER — Other Ambulatory Visit: Payer: Self-pay | Admitting: Family Medicine

## 2021-11-19 DIAGNOSIS — M79602 Pain in left arm: Secondary | ICD-10-CM

## 2021-12-21 ENCOUNTER — Encounter (HOSPITAL_COMMUNITY): Payer: Self-pay

## 2021-12-21 ENCOUNTER — Ambulatory Visit (HOSPITAL_COMMUNITY): Admission: RE | Admit: 2021-12-21 | Payer: Medicare HMO | Source: Ambulatory Visit

## 2021-12-31 DIAGNOSIS — G5603 Carpal tunnel syndrome, bilateral upper limbs: Secondary | ICD-10-CM | POA: Diagnosis not present

## 2021-12-31 DIAGNOSIS — M79632 Pain in left forearm: Secondary | ICD-10-CM | POA: Diagnosis not present

## 2021-12-31 DIAGNOSIS — I10 Essential (primary) hypertension: Secondary | ICD-10-CM | POA: Diagnosis not present

## 2022-03-05 DIAGNOSIS — Z125 Encounter for screening for malignant neoplasm of prostate: Secondary | ICD-10-CM | POA: Diagnosis not present

## 2022-03-05 DIAGNOSIS — K625 Hemorrhage of anus and rectum: Secondary | ICD-10-CM | POA: Diagnosis not present

## 2022-03-05 DIAGNOSIS — G5603 Carpal tunnel syndrome, bilateral upper limbs: Secondary | ICD-10-CM | POA: Diagnosis not present

## 2022-03-05 DIAGNOSIS — I10 Essential (primary) hypertension: Secondary | ICD-10-CM | POA: Diagnosis not present

## 2022-03-05 DIAGNOSIS — Z6826 Body mass index (BMI) 26.0-26.9, adult: Secondary | ICD-10-CM | POA: Diagnosis not present

## 2022-04-08 DIAGNOSIS — K625 Hemorrhage of anus and rectum: Secondary | ICD-10-CM | POA: Diagnosis not present

## 2022-04-08 DIAGNOSIS — K649 Unspecified hemorrhoids: Secondary | ICD-10-CM | POA: Diagnosis not present

## 2022-04-08 DIAGNOSIS — Z0000 Encounter for general adult medical examination without abnormal findings: Secondary | ICD-10-CM | POA: Diagnosis not present

## 2022-04-08 DIAGNOSIS — I10 Essential (primary) hypertension: Secondary | ICD-10-CM | POA: Diagnosis not present

## 2022-07-09 DIAGNOSIS — I10 Essential (primary) hypertension: Secondary | ICD-10-CM | POA: Diagnosis not present

## 2022-07-09 DIAGNOSIS — E785 Hyperlipidemia, unspecified: Secondary | ICD-10-CM | POA: Diagnosis not present

## 2022-07-09 DIAGNOSIS — M1711 Unilateral primary osteoarthritis, right knee: Secondary | ICD-10-CM | POA: Diagnosis not present
